# Patient Record
Sex: Female | Born: 1945 | Race: White | Hispanic: No | Marital: Married | Smoking: Former smoker
Health system: Southern US, Community
[De-identification: ages and names within clinical notes are randomized; demographics above are authoritative.]

## PROBLEM LIST (undated history)

## (undated) DIAGNOSIS — I671 Cerebral aneurysm, nonruptured: Secondary | ICD-10-CM

## (undated) DIAGNOSIS — K219 Gastro-esophageal reflux disease without esophagitis: Secondary | ICD-10-CM

## (undated) DIAGNOSIS — I10 Essential (primary) hypertension: Secondary | ICD-10-CM

## (undated) DIAGNOSIS — D869 Sarcoidosis, unspecified: Secondary | ICD-10-CM

## (undated) HISTORY — PX: BREAST SURGERY: SHX581

## (undated) HISTORY — PX: HAMMER TOE SURGERY: SHX385

## (undated) HISTORY — PX: INTRACRANIAL ANEURYSM REPAIR: SHX1839

## (undated) HISTORY — PX: FOOT SURGERY: SHX648

## (undated) HISTORY — PX: ABDOMINAL HYSTERECTOMY: SHX81

## (undated) HISTORY — PX: TUMOR EXCISION: SHX421

## (undated) HISTORY — PX: TONSILLECTOMY: SUR1361

## (undated) HISTORY — PX: HEMORRHOID SURGERY: SHX153

## (undated) HISTORY — PX: FUNCTIONAL ENDOSCOPIC SINUS SURGERY: SUR616

---

## 1995-06-19 DIAGNOSIS — I671 Cerebral aneurysm, nonruptured: Secondary | ICD-10-CM

## 1995-06-19 HISTORY — DX: Cerebral aneurysm, nonruptured: I67.1

## 1999-09-13 ENCOUNTER — Encounter: Payer: Self-pay | Admitting: Obstetrics and Gynecology

## 1999-09-13 ENCOUNTER — Encounter: Admission: RE | Admit: 1999-09-13 | Discharge: 1999-09-13 | Payer: Self-pay | Admitting: Obstetrics and Gynecology

## 1999-12-05 ENCOUNTER — Encounter: Admission: RE | Admit: 1999-12-05 | Discharge: 1999-12-05 | Payer: Self-pay | Admitting: Vascular Surgery

## 1999-12-05 ENCOUNTER — Encounter: Payer: Self-pay | Admitting: Obstetrics and Gynecology

## 2000-10-15 ENCOUNTER — Encounter: Payer: Self-pay | Admitting: Surgery

## 2000-10-15 ENCOUNTER — Encounter: Admission: RE | Admit: 2000-10-15 | Discharge: 2000-10-15 | Payer: Self-pay | Admitting: Surgery

## 2001-10-21 ENCOUNTER — Encounter: Admission: RE | Admit: 2001-10-21 | Discharge: 2001-10-21 | Payer: Self-pay | Admitting: Obstetrics and Gynecology

## 2001-10-21 ENCOUNTER — Encounter: Payer: Self-pay | Admitting: Obstetrics and Gynecology

## 2002-11-11 ENCOUNTER — Encounter: Payer: Self-pay | Admitting: Obstetrics and Gynecology

## 2002-11-11 ENCOUNTER — Encounter: Admission: RE | Admit: 2002-11-11 | Discharge: 2002-11-11 | Payer: Self-pay | Admitting: Obstetrics and Gynecology

## 2003-11-23 ENCOUNTER — Encounter: Admission: RE | Admit: 2003-11-23 | Discharge: 2003-11-23 | Payer: Self-pay | Admitting: Obstetrics and Gynecology

## 2004-03-29 ENCOUNTER — Ambulatory Visit: Payer: Self-pay | Admitting: Internal Medicine

## 2005-01-16 ENCOUNTER — Encounter: Admission: RE | Admit: 2005-01-16 | Discharge: 2005-01-16 | Payer: Self-pay | Admitting: Obstetrics and Gynecology

## 2006-03-04 ENCOUNTER — Ambulatory Visit: Payer: Self-pay | Admitting: Gastroenterology

## 2007-04-01 ENCOUNTER — Encounter: Admission: RE | Admit: 2007-04-01 | Discharge: 2007-04-01 | Payer: Self-pay | Admitting: Obstetrics and Gynecology

## 2007-12-12 ENCOUNTER — Ambulatory Visit: Payer: Self-pay | Admitting: Internal Medicine

## 2008-04-27 ENCOUNTER — Encounter: Admission: RE | Admit: 2008-04-27 | Discharge: 2008-04-27 | Payer: Self-pay | Admitting: Obstetrics and Gynecology

## 2008-05-04 ENCOUNTER — Encounter: Admission: RE | Admit: 2008-05-04 | Discharge: 2008-05-04 | Payer: Self-pay | Admitting: Obstetrics and Gynecology

## 2008-11-24 ENCOUNTER — Encounter: Admission: RE | Admit: 2008-11-24 | Discharge: 2008-11-24 | Payer: Self-pay | Admitting: Obstetrics and Gynecology

## 2009-03-28 ENCOUNTER — Ambulatory Visit: Payer: Self-pay | Admitting: Gastroenterology

## 2009-05-25 ENCOUNTER — Encounter: Admission: RE | Admit: 2009-05-25 | Discharge: 2009-05-25 | Payer: Self-pay | Admitting: Obstetrics and Gynecology

## 2009-12-07 ENCOUNTER — Encounter: Admission: RE | Admit: 2009-12-07 | Discharge: 2009-12-07 | Payer: Self-pay | Admitting: Obstetrics and Gynecology

## 2009-12-15 ENCOUNTER — Ambulatory Visit: Payer: Self-pay | Admitting: Internal Medicine

## 2009-12-16 ENCOUNTER — Ambulatory Visit: Payer: Self-pay | Admitting: Cardiothoracic Surgery

## 2009-12-23 ENCOUNTER — Ambulatory Visit: Payer: Self-pay | Admitting: Specialist

## 2009-12-27 ENCOUNTER — Ambulatory Visit: Payer: Self-pay | Admitting: Internal Medicine

## 2010-01-04 ENCOUNTER — Ambulatory Visit: Payer: Self-pay | Admitting: Cardiothoracic Surgery

## 2010-01-16 ENCOUNTER — Ambulatory Visit: Payer: Self-pay | Admitting: Cardiothoracic Surgery

## 2010-01-25 ENCOUNTER — Ambulatory Visit: Payer: Self-pay | Admitting: Cardiothoracic Surgery

## 2010-01-30 ENCOUNTER — Inpatient Hospital Stay: Payer: Self-pay | Admitting: Internal Medicine

## 2010-03-29 ENCOUNTER — Ambulatory Visit: Payer: Self-pay | Admitting: Cardiothoracic Surgery

## 2010-07-09 ENCOUNTER — Encounter: Payer: Self-pay | Admitting: Obstetrics and Gynecology

## 2010-11-20 ENCOUNTER — Ambulatory Visit: Payer: Self-pay | Admitting: Internal Medicine

## 2010-11-30 ENCOUNTER — Other Ambulatory Visit: Payer: Self-pay | Admitting: Internal Medicine

## 2010-11-30 DIAGNOSIS — Z1231 Encounter for screening mammogram for malignant neoplasm of breast: Secondary | ICD-10-CM

## 2010-12-05 ENCOUNTER — Ambulatory Visit
Admission: RE | Admit: 2010-12-05 | Discharge: 2010-12-05 | Disposition: A | Discharge status: Home / Self Care | Payer: Medicare HMO | Source: Ambulatory Visit | Attending: Internal Medicine | Admitting: Internal Medicine

## 2010-12-05 DIAGNOSIS — Z1231 Encounter for screening mammogram for malignant neoplasm of breast: Secondary | ICD-10-CM

## 2011-07-30 IMAGING — US ABDOMEN ULTRASOUND LIMITED
1 series · 17 of 25 positions shown · non-contrast
Comparison: none

REASON FOR EXAM: ruq pain,fever
COMMENTS:

[Series 1: abdomen ultrasound limited · 17 of 37 slices shown]
[im 1/37]
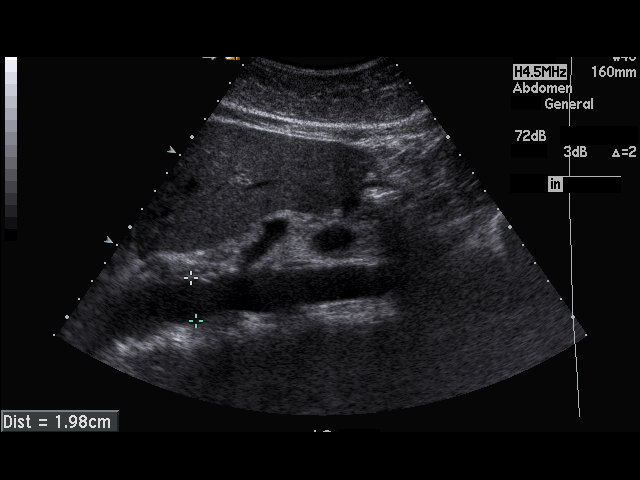
[im 4/37]
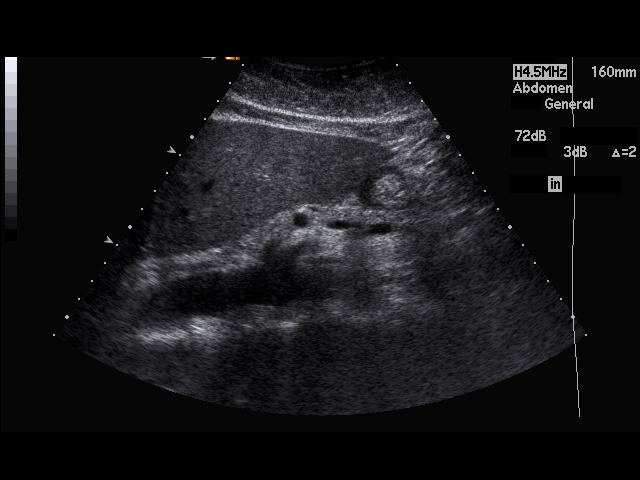
[im 5/37]
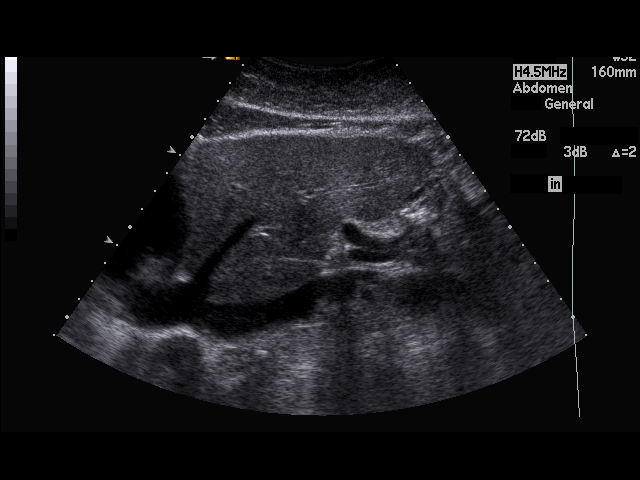
[im 8/37]
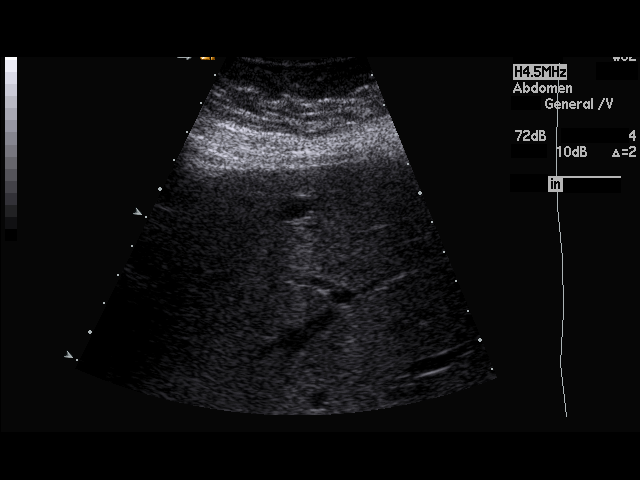
[im 10/37]
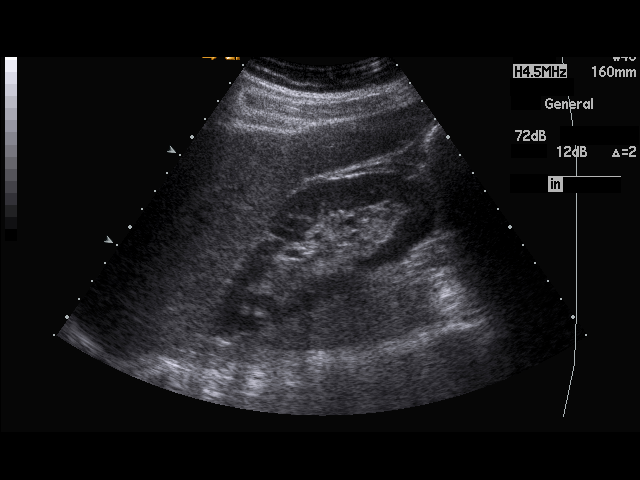
[im 13/37]
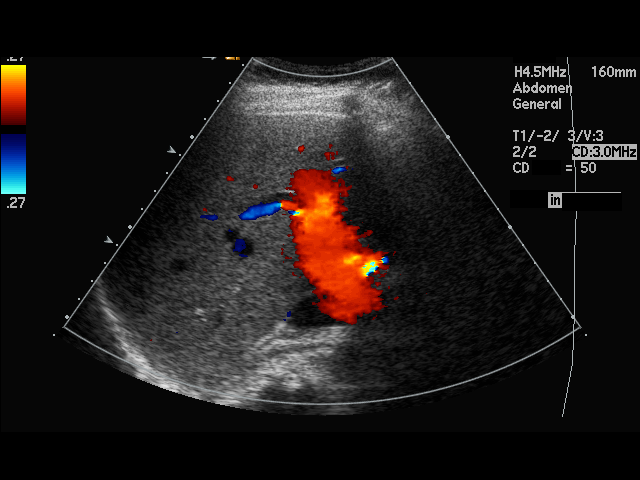
[im 14/37]
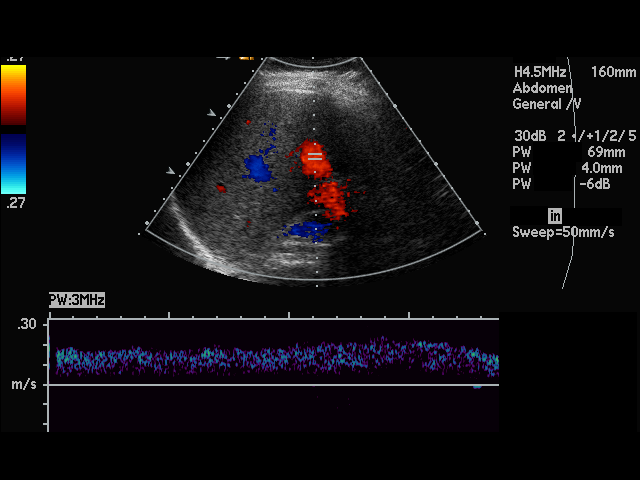
[im 17/37]
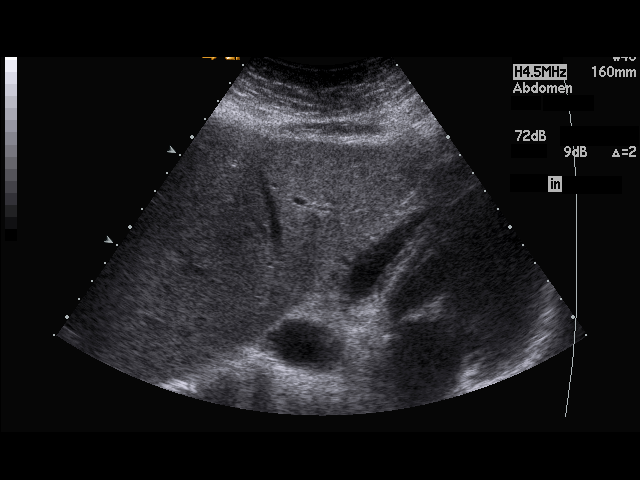
[im 19/37]
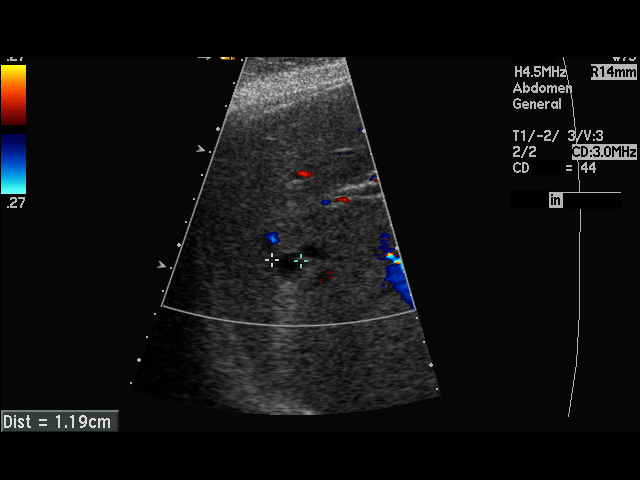
[im 20/37]
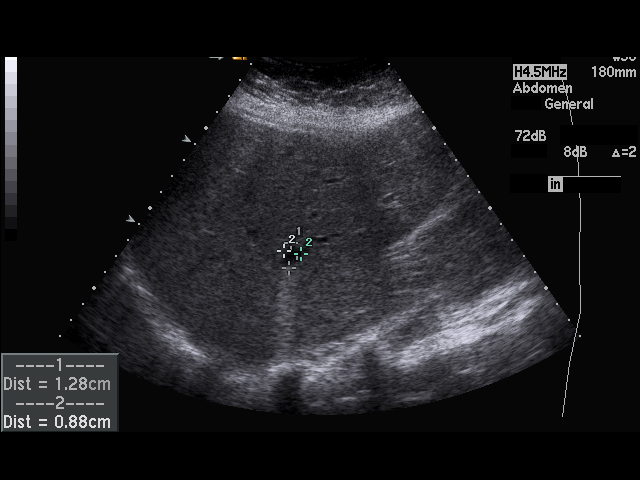
[im 23/37]
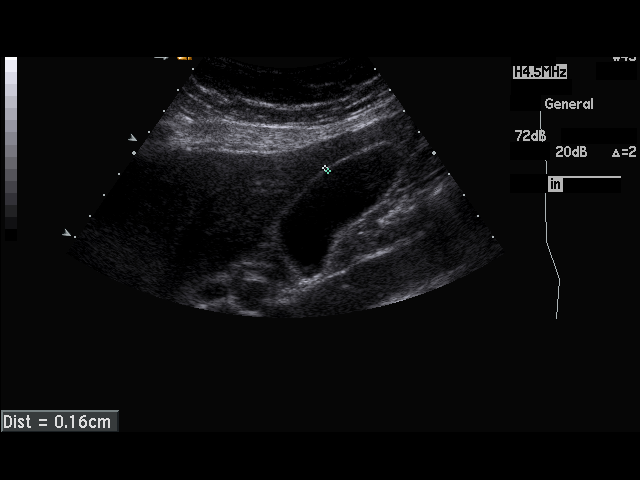
[im 25/37]
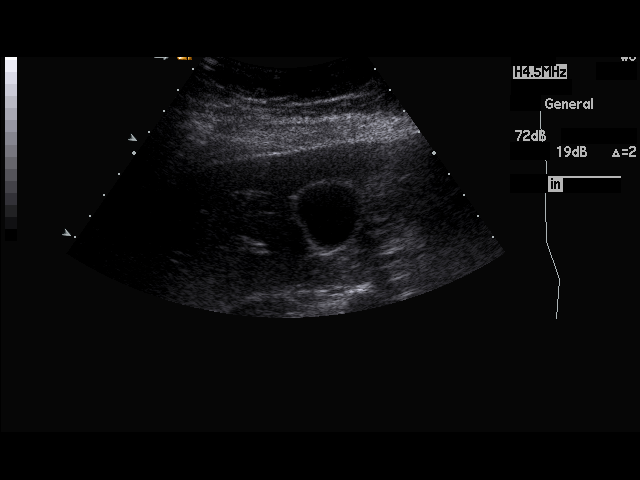
[im 28/37]
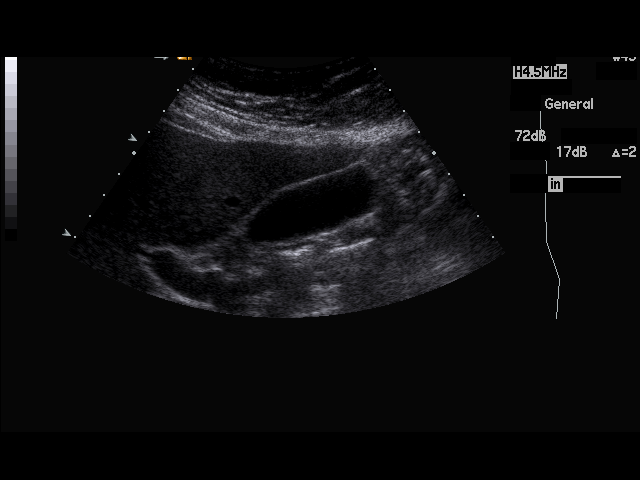
[im 29/37]
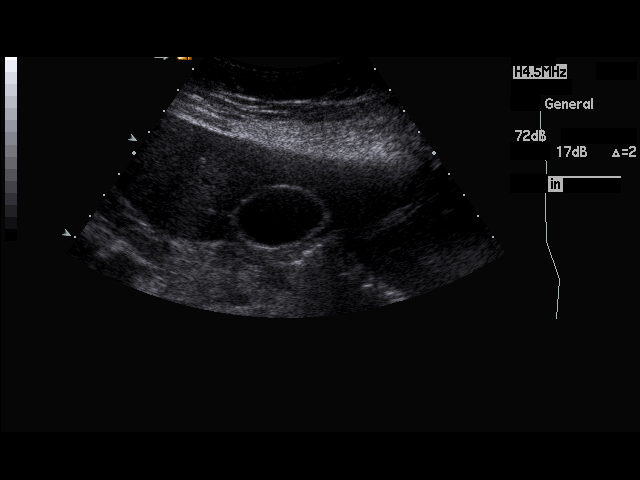
[im 32/37]
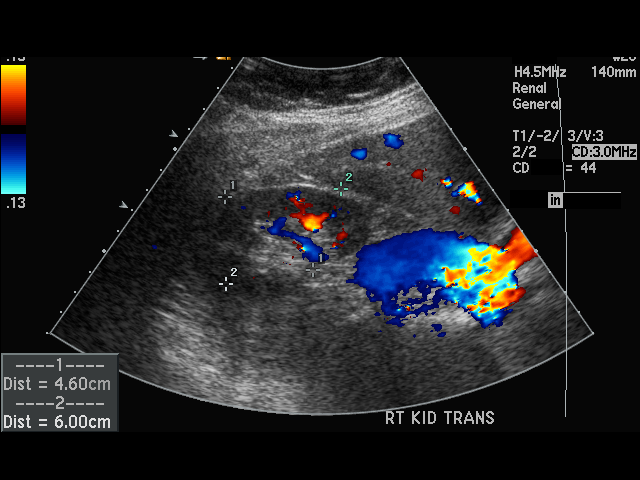
[im 34/37]
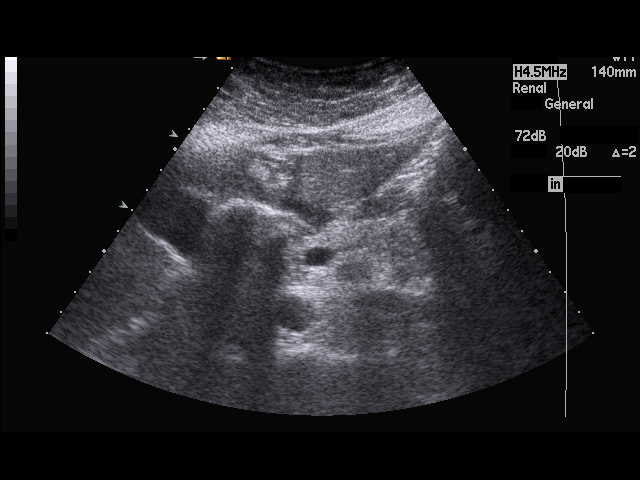
[im 37/37]
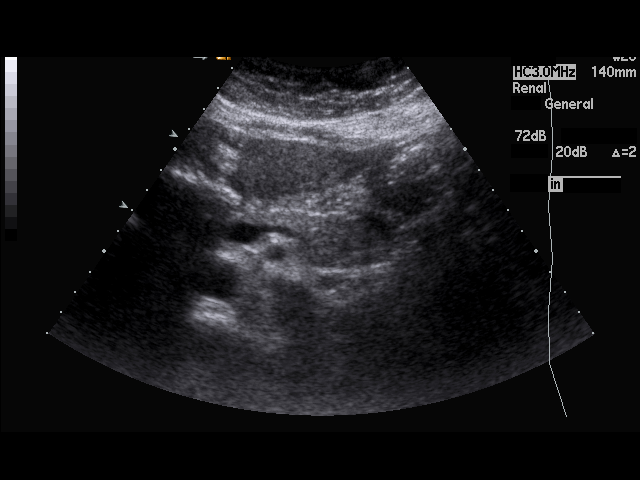

[17 of 25 positions shown; findings below may reference images not displayed]

PROCEDURE:     US  - US ABDOMEN LIMITED SURVEY  - January 31, 2010  [DATE]

RESULT:     Limited examination for evaluation of the right upper quadrant
was performed as requested. No gallstones are seen. There is no thickening
of the gallbladder wall. The common bile duct measures 3.2 mm in diameter
which is within normal limits. The hepatic echo pattern is normal in
appearance except for two small cysts with the larger measuring 1.28 cm at
maximum diameter. The right kidney is visualized and shows no hydronephrosis.
IMPRESSION: Limited examination of the right upper quadrant shows no gallstones or other
acute change. A few hepatic cysts are noted.

## 2011-07-30 IMAGING — CT CT CHEST W/ CM
1 series · 15 of 32 positions shown, 19 images · non-contrast
Comparison: PET/CT -12/23/2009, CT Chest - 12/15/2009

REASON FOR EXAM: COMMENTS:

PROCEDURE:     CT  - CT CHEST (FOR PE) W  - January 31, 2010  [DATE]
RESULT:      Indications: Cough, fever
TECHNIQUE: A thin-section spiral CT from the lung apices to the upper
abdomen was acquired on a multi slice scanner following 100ml Ysovue-FQE
intravenous contrast. These images were then transferred to the Siemens work
station and were subsequently reviewed utilizing 3-D reconstructions and MIP
images.

[Series 4: soft tissue · axial · 0.71mm/px · z∈[-757,-466]mm · 15 of 111 slices shown, 19 images]
[im 9/111  mediastinal]
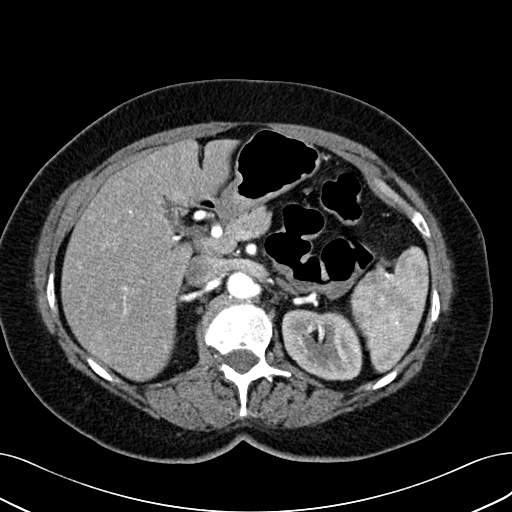
[im 9/111  lung]
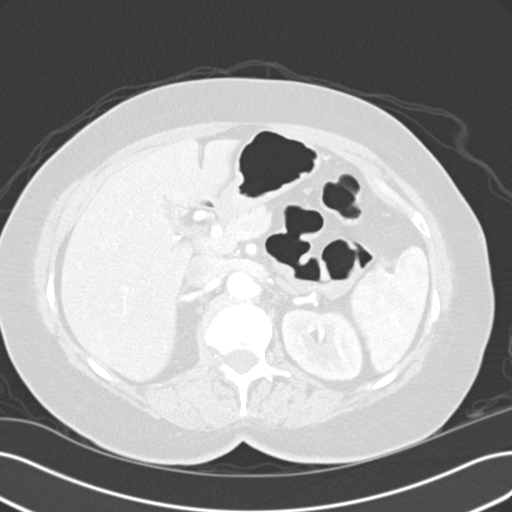
[im 17/111  lung]
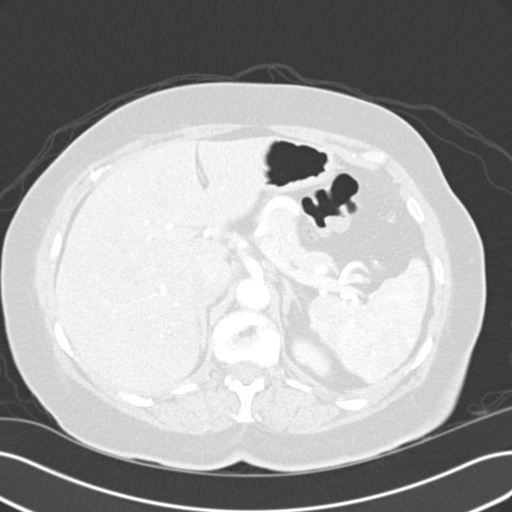
[im 23/111  lung]
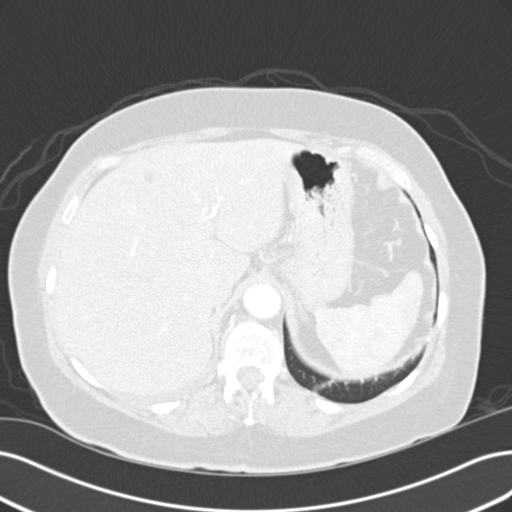
[im 29/111  lung]
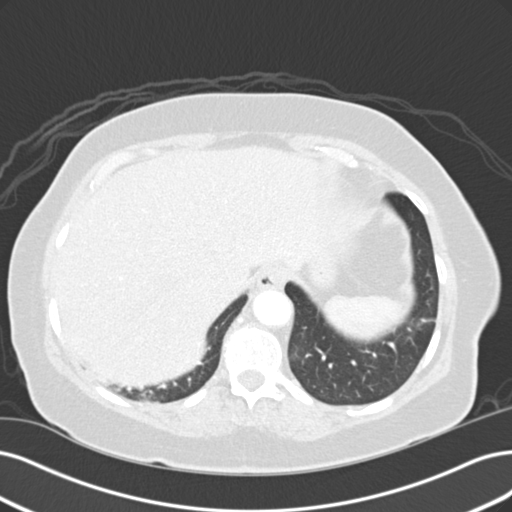
[im 37/111  mediastinal]
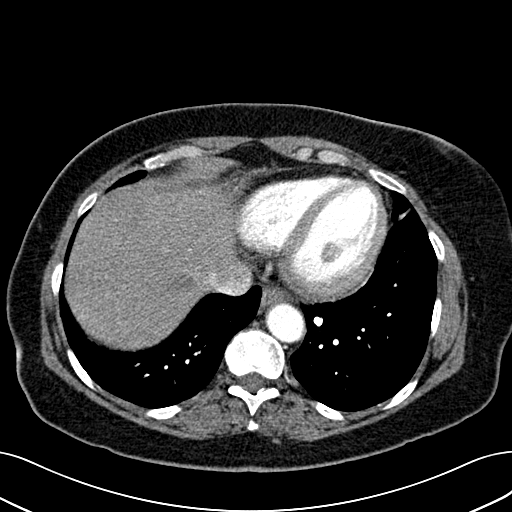
[im 37/111  lung]
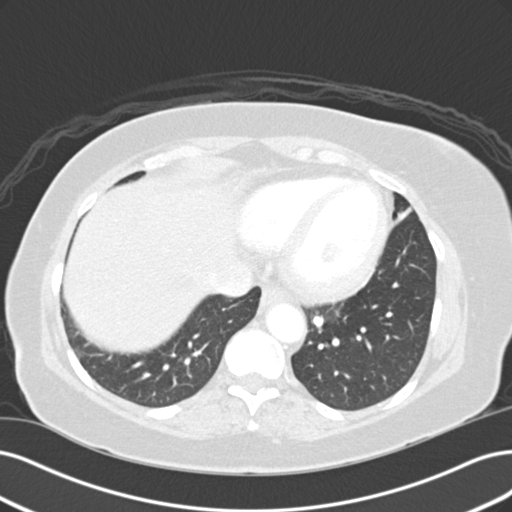
[im 45/111  lung]
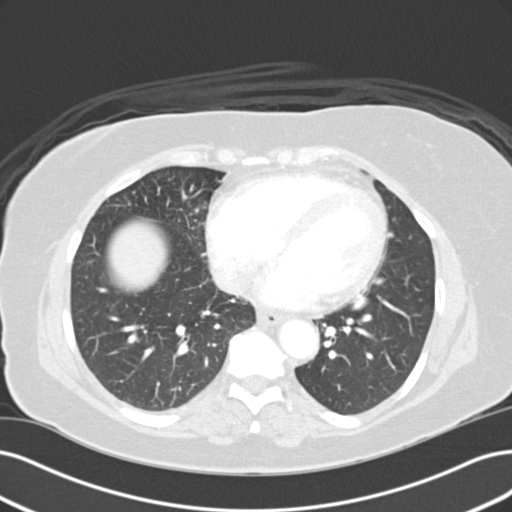
[im 53/111  lung]
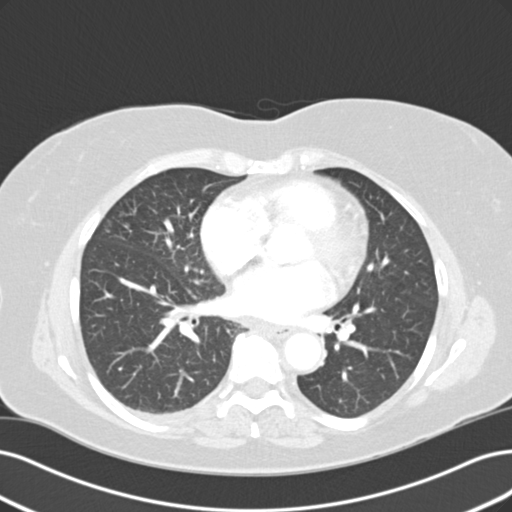
[im 59/111  lung]
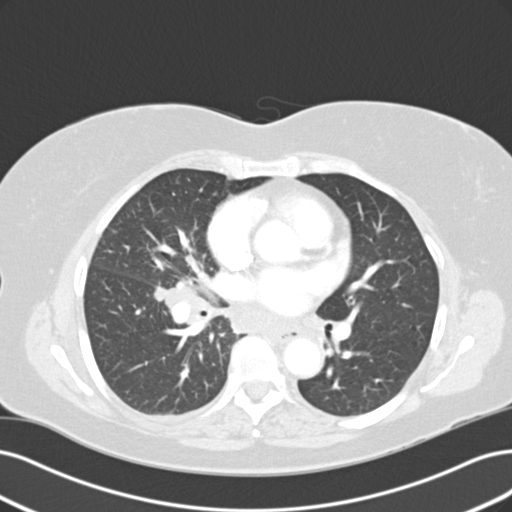
[im 66/111  mediastinal]
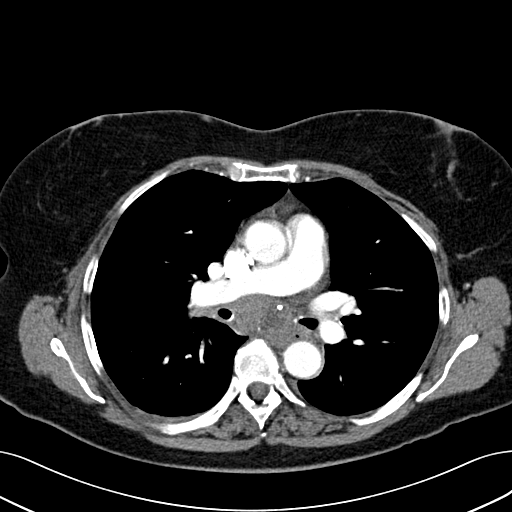
[im 66/111  lung]
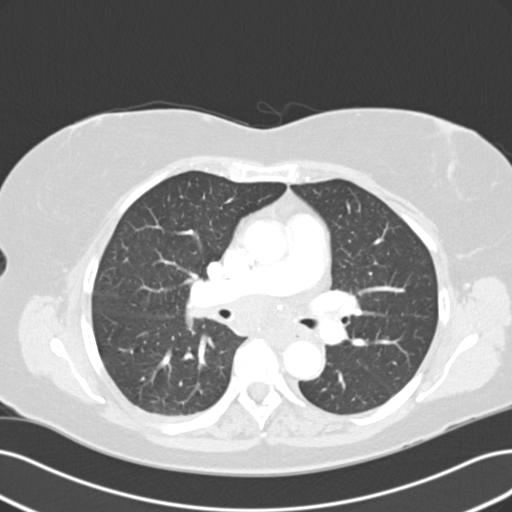
[im 70/111  lung]
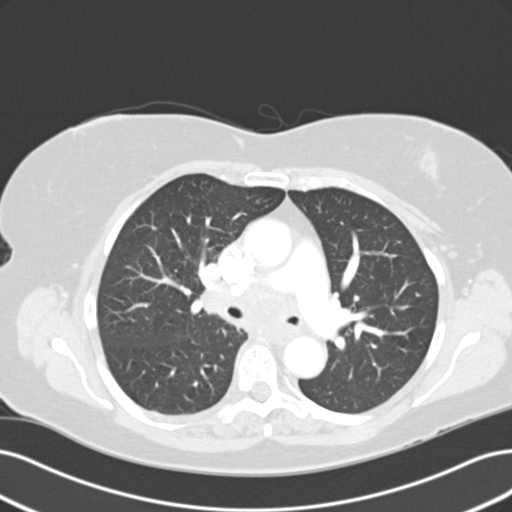
[im 78/111  lung]
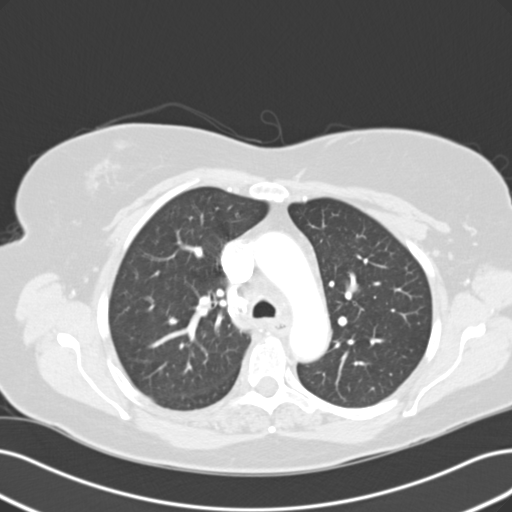
[im 86/111  lung]
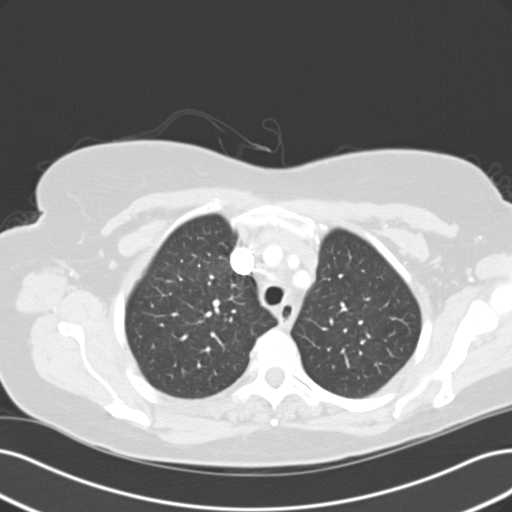
[im 90/111  mediastinal]
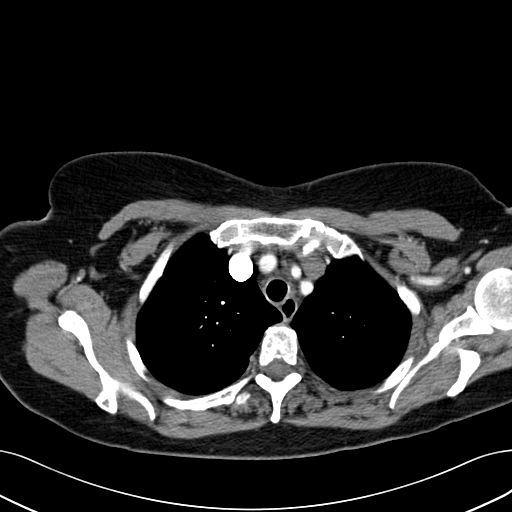
[im 90/111  lung]
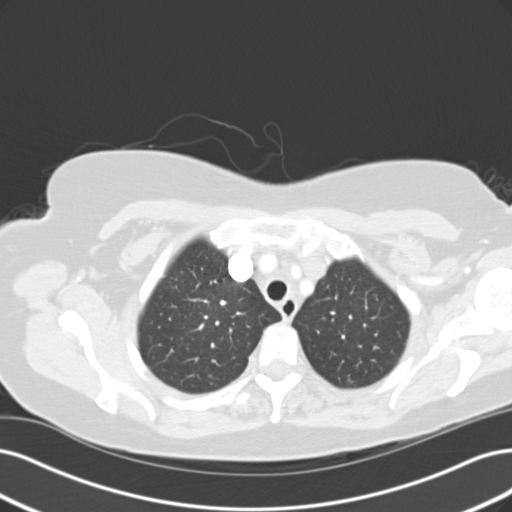
[im 98/111  lung]
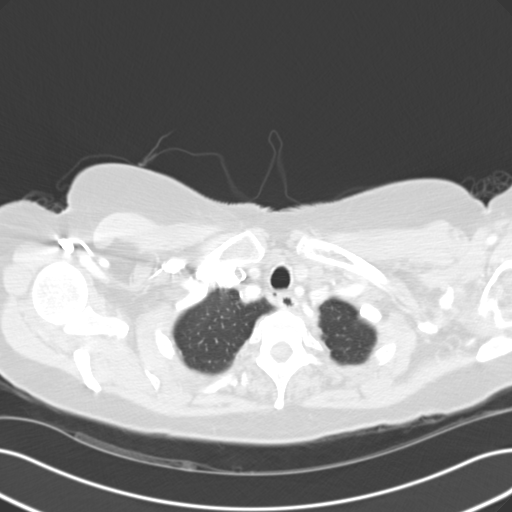
[im 106/111  lung]
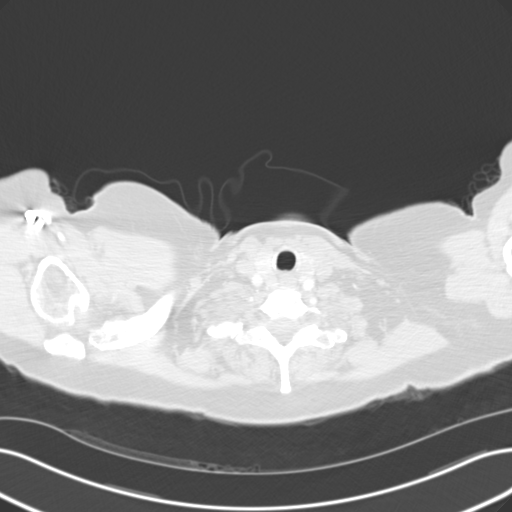

[15 of 32 positions shown; findings below may reference images not displayed]

FINDINGS: There is adequate opacification of the pulmonary arteries. There is no
pulmonary embolus. The main pulmonary artery, right main pulmonary artery,
and left main pulmonary arteries are normal in size. The heart size is
normal. There is no pericardial effusion.

The lungs are clear. There is no focal consolidation, pleural effusion, or
pneumothorax.

There is a large complex cystic subcarinal mass measuring 3.3 x 4.5 x 4.6 cm
with areas of fluid attenuation which may represent necrosis. There are
punctate calcifications within the subcarinal mass.. There is right hilar
lymphadenopathy with the largest right hilar lymph node measuring 1.4 cm.

The osseous structures are unremarkable.

The visualized portions of the upper abdomen are unremarkable.
IMPRESSION: 1. No CT evidence of pulmonary embolus.

2. There is a large complex cystic subcarinal mass measuring 3.3 x 4.5 x
cm with areas of fluid attenuation which may represent necrosis. There are
punctate calcifications within the subcarinal mass. The overall mass has
enlarged compared with the prior examination dated 12/15/2009 at which time
the mass measured 2.1 x 3.6 x 3 cm. Possible etiologies include infection
including atypical infection such as tuberculosis and fungal processes
versus necrotic malignancy. Recommend pulmonary consultation. Correlate with
PPD.

## 2011-09-28 ENCOUNTER — Other Ambulatory Visit: Payer: Self-pay | Admitting: Obstetrics

## 2011-09-28 DIAGNOSIS — E2839 Other primary ovarian failure: Secondary | ICD-10-CM

## 2011-09-28 DIAGNOSIS — Z780 Asymptomatic menopausal state: Secondary | ICD-10-CM

## 2011-09-28 DIAGNOSIS — Z1231 Encounter for screening mammogram for malignant neoplasm of breast: Secondary | ICD-10-CM

## 2011-12-25 ENCOUNTER — Ambulatory Visit
Admission: RE | Admit: 2011-12-25 | Discharge: 2011-12-25 | Disposition: A | Discharge status: Home / Self Care | Payer: Medicare Other | Source: Ambulatory Visit | Attending: Obstetrics | Admitting: Obstetrics

## 2011-12-25 DIAGNOSIS — Z780 Asymptomatic menopausal state: Secondary | ICD-10-CM

## 2011-12-25 DIAGNOSIS — Z1231 Encounter for screening mammogram for malignant neoplasm of breast: Secondary | ICD-10-CM

## 2011-12-25 DIAGNOSIS — E2839 Other primary ovarian failure: Secondary | ICD-10-CM

## 2012-03-26 ENCOUNTER — Ambulatory Visit: Payer: Self-pay | Admitting: Orthopedic Surgery

## 2012-12-24 ENCOUNTER — Ambulatory Visit: Payer: Self-pay | Admitting: Gastroenterology

## 2013-12-03 ENCOUNTER — Other Ambulatory Visit: Payer: Self-pay

## 2013-12-03 DIAGNOSIS — Z1231 Encounter for screening mammogram for malignant neoplasm of breast: Secondary | ICD-10-CM

## 2014-01-12 ENCOUNTER — Ambulatory Visit
Admission: RE | Admit: 2014-01-12 | Discharge: 2014-01-12 | Disposition: A | Discharge status: Home / Self Care | Payer: Medicare Other | Source: Ambulatory Visit | Attending: Obstetrics | Admitting: Obstetrics

## 2014-01-12 ENCOUNTER — Ambulatory Visit
Admission: RE | Admit: 2014-01-12 | Discharge: 2014-01-12 | Disposition: A | Discharge status: Home / Self Care | Payer: Medicare Other | Source: Ambulatory Visit

## 2014-01-12 ENCOUNTER — Encounter (INDEPENDENT_AMBULATORY_CARE_PROVIDER_SITE_OTHER): Payer: Self-pay

## 2014-01-12 ENCOUNTER — Other Ambulatory Visit: Payer: Self-pay | Admitting: Obstetrics

## 2014-01-12 DIAGNOSIS — E2839 Other primary ovarian failure: Secondary | ICD-10-CM

## 2014-01-12 DIAGNOSIS — Z1231 Encounter for screening mammogram for malignant neoplasm of breast: Secondary | ICD-10-CM

## 2015-08-26 ENCOUNTER — Encounter: Payer: Self-pay | Admitting: *Deleted

## 2015-08-29 ENCOUNTER — Ambulatory Visit: Payer: Medicare Other | Admitting: Registered Nurse

## 2015-08-29 ENCOUNTER — Encounter
Admission: RE | Disposition: A | Discharge status: Home / Self Care | Payer: Self-pay | Source: Ambulatory Visit | Attending: Gastroenterology

## 2015-08-29 ENCOUNTER — Ambulatory Visit
Admission: RE | Admit: 2015-08-29 | Discharge: 2015-08-29 | Disposition: A | Discharge status: Home / Self Care | Payer: Medicare Other | Source: Ambulatory Visit | Attending: Gastroenterology | Admitting: Gastroenterology

## 2015-08-29 ENCOUNTER — Encounter: Payer: Self-pay | Admitting: *Deleted

## 2015-08-29 DIAGNOSIS — Z7951 Long term (current) use of inhaled steroids: Secondary | ICD-10-CM | POA: Insufficient documentation

## 2015-08-29 DIAGNOSIS — Z87891 Personal history of nicotine dependence: Secondary | ICD-10-CM | POA: Diagnosis not present

## 2015-08-29 DIAGNOSIS — Z79899 Other long term (current) drug therapy: Secondary | ICD-10-CM | POA: Insufficient documentation

## 2015-08-29 DIAGNOSIS — I10 Essential (primary) hypertension: Secondary | ICD-10-CM | POA: Insufficient documentation

## 2015-08-29 DIAGNOSIS — K219 Gastro-esophageal reflux disease without esophagitis: Secondary | ICD-10-CM | POA: Diagnosis not present

## 2015-08-29 DIAGNOSIS — Z888 Allergy status to other drugs, medicaments and biological substances status: Secondary | ICD-10-CM | POA: Insufficient documentation

## 2015-08-29 DIAGNOSIS — K222 Esophageal obstruction: Secondary | ICD-10-CM | POA: Insufficient documentation

## 2015-08-29 DIAGNOSIS — I739 Peripheral vascular disease, unspecified: Secondary | ICD-10-CM | POA: Diagnosis not present

## 2015-08-29 DIAGNOSIS — R1310 Dysphagia, unspecified: Secondary | ICD-10-CM | POA: Insufficient documentation

## 2015-08-29 HISTORY — DX: Gastro-esophageal reflux disease without esophagitis: K21.9

## 2015-08-29 HISTORY — DX: Sarcoidosis, unspecified: D86.9

## 2015-08-29 HISTORY — DX: Essential (primary) hypertension: I10

## 2015-08-29 HISTORY — DX: Cerebral aneurysm, nonruptured: I67.1

## 2015-08-29 HISTORY — PX: ESOPHAGOGASTRODUODENOSCOPY (EGD) WITH PROPOFOL: SHX5813

## 2015-08-29 SURGERY — ESOPHAGOGASTRODUODENOSCOPY (EGD) WITH PROPOFOL
Anesthesia: General

## 2015-08-29 MED ORDER — SODIUM CHLORIDE 0.9 % IV SOLN: INTRAVENOUS | Status: DC

## 2015-08-29 MED ORDER — PROPOFOL 10 MG/ML IV BOLUS
INTRAVENOUS | Status: DC | PRN
  Administered 2015-08-29: 100 mg via INTRAVENOUS

## 2015-08-29 MED ORDER — SODIUM CHLORIDE 0.9 % IV SOLN
INTRAVENOUS | Status: DC
  Administered 2015-08-29: 09:00:00 via INTRAVENOUS

## 2015-08-29 NOTE — H&P (Signed)
  Date of Initial H&P: 08/03/2015  History reviewed, patient examined, no change in status, stable for surgery.

## 2015-08-29 NOTE — Op Note (Signed)
[facility] Gastroenterology Patient Name: Kristina Le Procedure Date: 08/29/2015 9:01 AM MRN: [id number] Account #: [id number] Date of Birth: [birth year] Admit Type: Outpatient Age: 70 Room: [facility] ENDO ROOM 4 Gender: Female Note Status: Finalized Procedure:            Upper GI endoscopy Indications:          Dysphagia Providers:            Ezzard Standing. Bluford Kaufmann, MD Referring MD:         Danella Penton, MD (Referring MD) Medicines:            Monitored Anesthesia Care Complications:        No immediate complications. Procedure:            Pre-Anesthesia Assessment:                       - Prior to the procedure, a History and Physical was                        performed, and patient medications, allergies and                        sensitivities were reviewed. The patient's tolerance of                        previous anesthesia was reviewed.                       - The risks and benefits of the procedure and the                        sedation options and risks were discussed with the                        patient. All questions were answered and informed                        consent was obtained.                       - After reviewing the risks and benefits, the patient                        was deemed in satisfactory condition to undergo the                        procedure.                       After obtaining informed consent, the endoscope was                        passed under direct vision. Throughout the procedure,                        the patient's blood pressure, pulse, and oxygen                        saturations were monitored continuously. The Endoscope  was introduced through the mouth, and advanced to the                        second part of duodenum. The upper GI endoscopy was                        accomplished without difficulty. The patient tolerated                        the procedure well. Findings:      One  mild benign-appearing, intrinsic stenosis was found in the lower       third of the esophagus. The scope was withdrawn. Dilation was performed       with a Maloney dilator with mild resistance at 54 Fr.      The exam was otherwise without abnormality.      The exam of the esophagus was otherwise normal.      The entire examined stomach was normal.      The examined duodenum was normal. Impression:           - Benign-appearing esophageal stenosis. Dilated.                       - The examination was otherwise normal.                       - Normal stomach.                       - Normal examined duodenum.                       - No specimens collected. Recommendation:       - Discharge patient to home.                       - Observe patient's clinical course.                       - The findings and recommendations were discussed with                        the patient. Procedure Code(s):    --- Professional ---                       [phone], Esophagogastroduodenoscopy, flexible, transoral;                        diagnostic, including collection of specimen(s) by                        brushing or washing, when performed (separate procedure)                       43450, Dilation of esophagus, by unguided sound or                        bougie, single or multiple passes Diagnosis Code(s):    --- Professional ---                       K22.2, Esophageal obstruction  R13.10, Dysphagia, unspecified CPT copyright 2016 American Medical Association. All rights reserved. The codes documented in this report are preliminary and upon coder review may  be revised to meet current compliance requirements. Wallace CullensPaul Y Keiry Kowal, MD 08/29/2015 9:10:49 AM This report has been signed electronically. Number of Addenda: 0 Note Initiated On: 08/29/2015 9:01 AM      [facility]

## 2015-08-29 NOTE — Anesthesia Postprocedure Evaluation (Signed)
Anesthesia Post Note  Patient: Kristina Le  Procedure(s) Performed: Procedure(s) (LRB): ESOPHAGOGASTRODUODENOSCOPY (EGD) WITH PROPOFOL (N/A)  Patient location during evaluation: PACU Anesthesia Type: General Level of consciousness: awake Pain management: satisfactory to patient Vital Signs Assessment: post-procedure vital signs reviewed and stable Respiratory status: nonlabored ventilation Cardiovascular status: stable Anesthetic complications: no    Last Vitals:  Filed Vitals:   08/29/15 0840 08/29/15 0912  BP: 137/67 135/74  Pulse: 68 75  Temp: 36.3 C 36.6 C  Resp: 15 16    Last Pain: There were no vitals filed for this visit.               VAN STAVEREN,Laramie Gelles

## 2015-08-29 NOTE — Transfer of Care (Signed)
Immediate Anesthesia Transfer of Care Note  Patient: Kristina Le  Procedure(s) Performed: Procedure(s): ESOPHAGOGASTRODUODENOSCOPY (EGD) WITH PROPOFOL (N/A)  Patient Location: PACU  Anesthesia Type:General  Level of Consciousness: awake and alert   Airway & Oxygen Therapy: Patient Spontanous Breathing and Patient connected to nasal cannula oxygen  Post-op Assessment: Report given to RN  Post vital signs: Reviewed and stable  Last Vitals:  Filed Vitals:   08/29/15 0840 08/29/15 0912  BP: 137/67 135/74  Pulse: 68 75  Temp: 36.3 C 36.6 C  Resp: 15 16    Complications: No apparent anesthesia complications

## 2015-08-29 NOTE — Anesthesia Preprocedure Evaluation (Signed)
Anesthesia Evaluation  Patient identified by MRN, date of birth, ID band Patient awake    Reviewed: Allergy & Precautions, NPO status , Patient's Chart, lab work & pertinent test results  Airway Mallampati: II       Dental  (+) Teeth Intact   Pulmonary former smoker,    breath sounds clear to auscultation       Cardiovascular hypertension, Pt. on medications + Peripheral Vascular Disease   Rhythm:Regular     Neuro/Psych    GI/Hepatic Neg liver ROS, GERD  ,  Endo/Other  negative endocrine ROS  Renal/GU negative Renal ROS     Musculoskeletal   Abdominal   Peds  Hematology negative hematology ROS (+)   Anesthesia Other Findings   Reproductive/Obstetrics                             Anesthesia Physical Anesthesia Plan  ASA: II  Anesthesia Plan: General   Post-op Pain Management:    Induction: Intravenous  Airway Management Planned: Natural Airway and Nasal Cannula  Additional Equipment:   Intra-op Plan:   Post-operative Plan:   Informed Consent: I have reviewed the patients History and Physical, chart, labs and discussed the procedure including the risks, benefits and alternatives for the proposed anesthesia with the patient or authorized representative who has indicated his/her understanding and acceptance.     Plan Discussed with: CRNA  Anesthesia Plan Comments:         Anesthesia Quick Evaluation

## 2015-08-30 ENCOUNTER — Encounter: Payer: Self-pay | Admitting: Gastroenterology

## 2016-04-12 ENCOUNTER — Other Ambulatory Visit: Payer: Self-pay | Admitting: Internal Medicine

## 2016-04-12 DIAGNOSIS — Z1231 Encounter for screening mammogram for malignant neoplasm of breast: Secondary | ICD-10-CM

## 2016-05-28 ENCOUNTER — Ambulatory Visit
Admission: RE | Admit: 2016-05-28 | Discharge: 2016-05-28 | Disposition: A | Discharge status: Home / Self Care | Payer: Medicare Other | Source: Ambulatory Visit | Attending: Internal Medicine | Admitting: Internal Medicine

## 2016-05-28 DIAGNOSIS — Z1231 Encounter for screening mammogram for malignant neoplasm of breast: Secondary | ICD-10-CM

## 2016-05-30 ENCOUNTER — Other Ambulatory Visit: Payer: Self-pay | Admitting: Internal Medicine

## 2016-05-30 DIAGNOSIS — R928 Other abnormal and inconclusive findings on diagnostic imaging of breast: Secondary | ICD-10-CM

## 2016-06-20 ENCOUNTER — Ambulatory Visit
Admission: RE | Admit: 2016-06-20 | Discharge: 2016-06-20 | Disposition: A | Discharge status: Home / Self Care | Payer: Medicare Other | Source: Ambulatory Visit | Attending: Internal Medicine | Admitting: Internal Medicine

## 2016-06-20 DIAGNOSIS — R928 Other abnormal and inconclusive findings on diagnostic imaging of breast: Secondary | ICD-10-CM

## 2017-08-05 ENCOUNTER — Ambulatory Visit
Admission: RE | Admit: 2017-08-05 | Discharge: 2017-08-05 | Disposition: A | Discharge status: Home / Self Care | Payer: Medicare Other | Source: Ambulatory Visit | Attending: Internal Medicine | Admitting: Internal Medicine

## 2017-08-05 ENCOUNTER — Other Ambulatory Visit: Payer: Self-pay | Admitting: Internal Medicine

## 2017-08-05 DIAGNOSIS — H70092 Acute mastoiditis with other complications, left ear: Secondary | ICD-10-CM

## 2017-08-05 DIAGNOSIS — R51 Headache: Secondary | ICD-10-CM

## 2017-08-05 DIAGNOSIS — J322 Chronic ethmoidal sinusitis: Secondary | ICD-10-CM | POA: Insufficient documentation

## 2017-08-05 DIAGNOSIS — R519 Headache, unspecified: Secondary | ICD-10-CM

## 2017-08-05 DIAGNOSIS — L0211 Cutaneous abscess of neck: Secondary | ICD-10-CM | POA: Diagnosis not present

## 2017-08-05 DIAGNOSIS — Z9889 Other specified postprocedural states: Secondary | ICD-10-CM | POA: Insufficient documentation

## 2017-08-05 DIAGNOSIS — I671 Cerebral aneurysm, nonruptured: Secondary | ICD-10-CM | POA: Insufficient documentation

## 2019-03-24 ENCOUNTER — Ambulatory Visit
Admission: RE | Admit: 2019-03-24 | Discharge: 2019-03-24 | Disposition: A | Discharge status: Home / Self Care | Payer: No Typology Code available for payment source | Source: Ambulatory Visit | Attending: Family Medicine | Admitting: Family Medicine

## 2019-03-24 ENCOUNTER — Other Ambulatory Visit (HOSPITAL_COMMUNITY): Payer: Self-pay | Admitting: Family Medicine

## 2019-03-24 ENCOUNTER — Other Ambulatory Visit: Payer: Self-pay | Admitting: Family Medicine

## 2019-03-24 ENCOUNTER — Other Ambulatory Visit: Payer: Self-pay

## 2019-03-24 DIAGNOSIS — G44319 Acute post-traumatic headache, not intractable: Secondary | ICD-10-CM | POA: Diagnosis present

## 2021-10-26 ENCOUNTER — Other Ambulatory Visit: Payer: Self-pay

## 2021-10-26 ENCOUNTER — Observation Stay
Admission: EM | Admit: 2021-10-26 | Discharge: 2021-10-27 | Disposition: A | Discharge status: Home / Self Care | Payer: Medicare Other | Attending: Hospitalist | Admitting: Hospitalist

## 2021-10-26 ENCOUNTER — Emergency Department: Payer: Medicare Other

## 2021-10-26 ENCOUNTER — Observation Stay: Payer: Medicare Other

## 2021-10-26 DIAGNOSIS — D62 Acute posthemorrhagic anemia: Secondary | ICD-10-CM | POA: Insufficient documentation

## 2021-10-26 DIAGNOSIS — Z79899 Other long term (current) drug therapy: Secondary | ICD-10-CM | POA: Insufficient documentation

## 2021-10-26 DIAGNOSIS — Z85828 Personal history of other malignant neoplasm of skin: Secondary | ICD-10-CM | POA: Insufficient documentation

## 2021-10-26 DIAGNOSIS — E876 Hypokalemia: Secondary | ICD-10-CM | POA: Insufficient documentation

## 2021-10-26 DIAGNOSIS — Z87891 Personal history of nicotine dependence: Secondary | ICD-10-CM | POA: Diagnosis not present

## 2021-10-26 DIAGNOSIS — R739 Hyperglycemia, unspecified: Secondary | ICD-10-CM | POA: Diagnosis not present

## 2021-10-26 DIAGNOSIS — J45909 Unspecified asthma, uncomplicated: Secondary | ICD-10-CM | POA: Diagnosis not present

## 2021-10-26 DIAGNOSIS — R112 Nausea with vomiting, unspecified: Secondary | ICD-10-CM | POA: Diagnosis not present

## 2021-10-26 DIAGNOSIS — N2889 Other specified disorders of kidney and ureter: Secondary | ICD-10-CM | POA: Diagnosis not present

## 2021-10-26 DIAGNOSIS — I10 Essential (primary) hypertension: Secondary | ICD-10-CM | POA: Diagnosis not present

## 2021-10-26 DIAGNOSIS — R109 Unspecified abdominal pain: Secondary | ICD-10-CM

## 2021-10-26 DIAGNOSIS — R1031 Right lower quadrant pain: Secondary | ICD-10-CM | POA: Diagnosis present

## 2021-10-26 LAB — URINALYSIS, ROUTINE W REFLEX MICROSCOPIC
Bilirubin Urine: NEGATIVE
Glucose, UA: NEGATIVE mg/dL
Ketones, ur: NEGATIVE mg/dL
Leukocytes,Ua: NEGATIVE
Nitrite: NEGATIVE
Protein, ur: NEGATIVE mg/dL
Specific Gravity, Urine: 1.019 (ref 1.005–1.030)
pH: 5 (ref 5.0–8.0)

## 2021-10-26 LAB — CBC
HCT: 36.6 % (ref 36.0–46.0)
Hemoglobin: 12.1 g/dL (ref 12.0–15.0)
MCH: 30 pg (ref 26.0–34.0)
MCHC: 33.1 g/dL (ref 30.0–36.0)
MCV: 90.8 fL (ref 80.0–100.0)
Platelets: 345 10*3/uL (ref 150–400)
RBC: 4.03 MIL/uL (ref 3.87–5.11)
RDW: 13.2 % (ref 11.5–15.5)
WBC: 8 10*3/uL (ref 4.0–10.5)
nRBC: 0 % (ref 0.0–0.2)

## 2021-10-26 LAB — APTT: aPTT: 23 seconds — ABNORMAL LOW (ref 24–36)

## 2021-10-26 LAB — COMPREHENSIVE METABOLIC PANEL
ALT: 20 U/L (ref 0–44)
AST: 26 U/L (ref 15–41)
Albumin: 3.6 g/dL (ref 3.5–5.0)
Alkaline Phosphatase: 45 U/L (ref 38–126)
Anion gap: 10 (ref 5–15)
BUN: 20 mg/dL (ref 8–23)
CO2: 29 mmol/L (ref 22–32)
Calcium: 9 mg/dL (ref 8.9–10.3)
Chloride: 100 mmol/L (ref 98–111)
Creatinine, Ser: 0.63 mg/dL (ref 0.44–1.00)
GFR, Estimated: >60 mL/min (ref >60)
Glucose, Bld: 184 mg/dL — ABNORMAL HIGH (ref 70–99)
Potassium: 3 mmol/L — ABNORMAL LOW (ref 3.5–5.1)
Sodium: 139 mmol/L (ref 135–145)
Total Bilirubin: 0.6 mg/dL (ref 0.3–1.2)
Total Protein: 6.7 g/dL (ref 6.5–8.1)

## 2021-10-26 LAB — TYPE AND SCREEN
ABO/RH(D): O POS
Antibody Screen: NEGATIVE

## 2021-10-26 LAB — GLUCOSE, CAPILLARY: Glucose-Capillary: 140 mg/dL — ABNORMAL HIGH (ref 70–99)

## 2021-10-26 LAB — PROTIME-INR
INR: 1 (ref 0.8–1.2)
Prothrombin Time: 13.1 seconds (ref 11.4–15.2)

## 2021-10-26 LAB — LIPASE, BLOOD: Lipase: 28 U/L (ref 11–51)

## 2021-10-26 MED ORDER — HYDROMORPHONE HCL 1 MG/ML IJ SOLN: 0.5000 mg | INTRAMUSCULAR | Status: DC | PRN

## 2021-10-26 MED ORDER — ONDANSETRON HCL 4 MG/2ML IJ SOLN
4.0000 mg | Freq: Four times a day (QID) | INTRAMUSCULAR | Status: DC | PRN
  Administered 2021-10-26: 4 mg via INTRAVENOUS
  Filled 2021-10-26: qty 2

## 2021-10-26 MED ORDER — HYDRALAZINE HCL 20 MG/ML IJ SOLN: 5.0000 mg | Freq: Four times a day (QID) | INTRAMUSCULAR | Status: DC | PRN

## 2021-10-26 MED ORDER — ONDANSETRON HCL 4 MG/2ML IJ SOLN
4.0000 mg | Freq: Once | INTRAMUSCULAR | Status: AC
  Administered 2021-10-26: 4 mg via INTRAVENOUS
  Filled 2021-10-26: qty 2

## 2021-10-26 MED ORDER — PANTOPRAZOLE SODIUM 40 MG PO TBEC
40.0000 mg | DELAYED_RELEASE_TABLET | Freq: Every day | ORAL | Status: DC
  Administered 2021-10-27: 40 mg via ORAL
  Filled 2021-10-26: qty 1

## 2021-10-26 MED ORDER — HYDROMORPHONE HCL 1 MG/ML IJ SOLN
1.0000 mg | Freq: Once | INTRAMUSCULAR | Status: AC
  Administered 2021-10-26: 1 mg via INTRAVENOUS
  Filled 2021-10-26: qty 1

## 2021-10-26 MED ORDER — INSULIN ASPART 100 UNIT/ML IJ SOLN: 0.0000 [IU] | Freq: Every day | INTRAMUSCULAR | Status: DC

## 2021-10-26 MED ORDER — POTASSIUM CHLORIDE 2 MEQ/ML IV SOLN
INTRAVENOUS | Status: AC
  Filled 2021-10-26 (×3): qty 1000

## 2021-10-26 MED ORDER — BUPROPION HCL ER (XL) 150 MG PO TB24
150.0000 mg | ORAL_TABLET | Freq: Every day | ORAL | Status: DC
Start: 2021-10-26 — End: 2021-10-27
  Administered 2021-10-27: 150 mg via ORAL
  Filled 2021-10-26 (×2): qty 1

## 2021-10-26 MED ORDER — ALBUTEROL SULFATE (2.5 MG/3ML) 0.083% IN NEBU: 3.0000 mL | INHALATION_SOLUTION | Freq: Four times a day (QID) | RESPIRATORY_TRACT | Status: DC | PRN

## 2021-10-26 MED ORDER — IOHEXOL 300 MG/ML  SOLN
50.0000 mL | Freq: Once | INTRAMUSCULAR | Status: AC | PRN
  Administered 2021-10-26: 50 mL via INTRAVENOUS

## 2021-10-26 MED ORDER — MELATONIN 5 MG PO TABS
5.0000 mg | ORAL_TABLET | Freq: Every evening | ORAL | Status: DC | PRN
  Administered 2021-10-26: 5 mg via ORAL
  Filled 2021-10-26: qty 1

## 2021-10-26 MED ORDER — BUPROPION HCL ER (SR) 150 MG PO TB12: 150.0000 mg | ORAL_TABLET | Freq: Every day | ORAL | Status: DC

## 2021-10-26 MED ORDER — AMLODIPINE BESYLATE 5 MG PO TABS
5.0000 mg | ORAL_TABLET | Freq: Every day | ORAL | Status: DC
  Administered 2021-10-27: 5 mg via ORAL
  Filled 2021-10-26 (×2): qty 1

## 2021-10-26 MED ORDER — MOMETASONE FURO-FORMOTEROL FUM 100-5 MCG/ACT IN AERO
2.0000 | INHALATION_SPRAY | Freq: Two times a day (BID) | RESPIRATORY_TRACT | Status: DC
  Administered 2021-10-27: 2 via RESPIRATORY_TRACT
  Filled 2021-10-26: qty 8.8

## 2021-10-26 MED ORDER — POTASSIUM CHLORIDE 10 MEQ/100ML IV SOLN
10.0000 meq | INTRAVENOUS | Status: AC
  Administered 2021-10-26 (×3): 10 meq via INTRAVENOUS
  Filled 2021-10-26 (×3): qty 100

## 2021-10-26 MED ORDER — MONTELUKAST SODIUM 10 MG PO TABS
10.0000 mg | ORAL_TABLET | Freq: Every evening | ORAL | Status: DC
  Administered 2021-10-26: 10 mg via ORAL
  Filled 2021-10-26: qty 1

## 2021-10-26 MED ORDER — MORPHINE SULFATE (PF) 4 MG/ML IV SOLN
4.0000 mg | Freq: Once | INTRAVENOUS | Status: AC
  Administered 2021-10-26: 4 mg via INTRAVENOUS
  Filled 2021-10-26: qty 1

## 2021-10-26 MED ORDER — POTASSIUM CHLORIDE CRYS ER 20 MEQ PO TBCR
40.0000 meq | EXTENDED_RELEASE_TABLET | Freq: Two times a day (BID) | ORAL | Status: DC
  Filled 2021-10-26: qty 2

## 2021-10-26 MED ORDER — INSULIN ASPART 100 UNIT/ML IJ SOLN
0.0000 [IU] | Freq: Three times a day (TID) | INTRAMUSCULAR | Status: DC
  Administered 2021-10-27: 2 [IU] via SUBCUTANEOUS
  Administered 2021-10-27: 1 [IU] via SUBCUTANEOUS
  Filled 2021-10-26 (×2): qty 1

## 2021-10-26 MED ORDER — OXYCODONE HCL 5 MG PO TABS: 5.0000 mg | ORAL_TABLET | ORAL | Status: DC | PRN

## 2021-10-26 MED ORDER — POLYETHYLENE GLYCOL 3350 17 G PO PACK: 17.0000 g | PACK | Freq: Every day | ORAL | Status: DC | PRN

## 2021-10-26 MED ORDER — IOHEXOL 300 MG/ML  SOLN
100.0000 mL | Freq: Once | INTRAMUSCULAR | Status: AC | PRN
  Administered 2021-10-26: 100 mL via INTRAVENOUS

## 2021-10-26 MED ORDER — FAMOTIDINE 20 MG PO TABS
40.0000 mg | ORAL_TABLET | Freq: Every day | ORAL | Status: DC
  Administered 2021-10-26: 40 mg via ORAL
  Filled 2021-10-26: qty 2

## 2021-10-26 NOTE — ED Triage Notes (Signed)
Pt comes into the ED via EMS from home with c/o abd pain right after taking protonix this morning for the first time, one episode of vomiting ?164/82 ?WS56 ?CBG148 ?98%RA ?#20gLAC ?

## 2021-10-26 NOTE — ED Provider Notes (Signed)
? ? ?[facility] ?Emergency Department Provider Note ? ? ? ? Event Date/Time  ? First MD Initiated Contact with Patient 10/26/21 1131   ?  (approximate) ? ? ?History  ? ?Abdominal Pain ? ? ?HPI ? ?Kristina Le is a 76 y.o. female with a history of hypertension, GERD, and sarcoidosis, resents to the ED via EMS from home.  Patient apparently experienced sudden right upper/lower quadrant abdominal and right flank pain approximately 1 hour after taking a dose of Protonix this morning.  She apparently ate as per usual after taking the medication, but reports this is her first time taking this particular medicine.  She reports 1 episode of nonbloody, nonbilious vomiting.  Vital signs reported stable per EMS.  Upon triage, patient is found to be hypotensive at 71/23 with a normal HR., ? ? ?Physical Exam  ? ?Triage Vital Signs: ?ED Triage Vitals  ?Enc Vitals Group  ?   BP 10/26/21 1124 (!) 71/23  ?   Pulse Rate 10/26/21 1124 85  ?   Resp 10/26/21 1124 16  ?   Temp 10/26/21 1124 97.6 ?F (36.4 ?C)  ?   Temp Source 10/26/21 1124 Oral  ?   SpO2 10/26/21 1124 95 %  ?   Weight 10/26/21 1123 171 lb (77.6 kg)  ?   Height 10/26/21 1123 5' 3.5" (1.613 m)  ?   Head Circumference --   ?   Peak Flow --   ?   Pain Score --   ?   Pain Loc --   ?   Pain Edu? --   ?   Excl. in [place]? --   ? ? ?Most recent vital signs: ?Vitals:  ? 10/26/21 1300 10/26/21 1430  ?BP: 118/60 (!) 142/81  ?Pulse: 66 75  ?Resp: 19 (!) 24  ?Temp:    ?SpO2: 97% 100%  ? ? ?General Awake, no distress.  ?CV:  Good peripheral perfusion. RRR ?RESP:  Normal effort. CTA ?ABD:  No distention. Soft, mildly tender to palp over the RUQ/RLQ. No CVA tenderness noted. ? ? ?ED Results / Procedures / Treatments  ? ?Labs ?(all labs ordered are listed, but only abnormal results are displayed) ?Labs Reviewed  ?COMPREHENSIVE METABOLIC PANEL - Abnormal; Notable for the following components:  ?    Result Value  ? Potassium 3.0 (*)   ? Glucose, Bld 184 (*)   ? All  other components within normal limits  ?URINALYSIS, ROUTINE W REFLEX MICROSCOPIC - Abnormal; Notable for the following components:  ? Color, Urine YELLOW (*)   ? APPearance CLEAR (*)   ? Hgb urine dipstick SMALL (*)   ? Bacteria, UA RARE (*)   ? All other components within normal limits  ?LIPASE, BLOOD  ?CBC  ?PROTIME-INR  ?APTT  ?TYPE AND SCREEN  ? ? ? ?EKG ? ?Vent. rate 69 BPM ?PR interval 151 ms ?QRS duration 103 ms ?QT/QTcB 401/430 ms ?P-R-T axes 71 69 59 ?NSR ? ?RADIOLOGY ? ?I personally viewed and evaluated these images as part of my medical decision making, as well as reviewing the written report by the radiologist. ? ?ED Provider Interpretation: right renal mass noted} ? ?CT ABDOMEN PELVIS W CONTRAST ? ?Result Date: 10/26/2021 ?CLINICAL DATA:  Right abdominal pain. EXAM: CT ABDOMEN AND PELVIS WITH CONTRAST TECHNIQUE: Multidetector CT imaging of the abdomen and pelvis was performed using the standard protocol following bolus administration of intravenous contrast. RADIATION DOSE REDUCTION: This exam was performed according to the departmental dose-optimization  program which includes automated exposure control, adjustment of the mA and/or kV according to patient size and/or use of iterative reconstruction technique. CONTRAST:  155mL OMNIPAQUE IOHEXOL 300 MG/ML  SOLN COMPARISON:  None Available. FINDINGS: Lower chest: Unremarkable. Hepatobiliary: No suspicious focal abnormality within the liver parenchyma. There is no evidence for gallstones, gallbladder wall thickening, or pericholecystic fluid. No intrahepatic or extrahepatic biliary dilation. Pancreas: No focal mass lesion. No dilatation of the main duct. No intraparenchymal cyst. No peripancreatic edema. Spleen: No splenomegaly. No focal mass lesion. Adrenals/Urinary Tract: No adrenal nodule or mass. Left kidney and ureter unremarkable. Bladder is unremarkable. 5.3 x 5.6 x 5.0 cm masslike lesion identified in the upper pole right kidney. This is associated  with fairly extensive perinephric hemorrhage around the upper pole right kidney extending anteriorly around the IVC and inferiorly in the perinephric space around the right kidney. Kidney is displaced inferiorly and rotated secondary to mass-effect from the lesion and hemorrhage. Right renal vein is markedly attenuated with only a string sign of opacification visible. Intraluminal thrombus in the right renal vein is not excluded. The right renal artery is markedly attenuated but does opacify. Stomach/Bowel: Stomach is unremarkable. No gastric wall thickening. No evidence of outlet obstruction. Duodenum is normally positioned as is the ligament of Treitz. No small bowel wall thickening. No small bowel dilatation. The terminal ileum is normal. The appendix is normal. No gross colonic mass. No colonic wall thickening. Vascular/Lymphatic: There is mild atherosclerotic calcification of the abdominal aorta without aneurysm. There is no gastrohepatic or hepatoduodenal ligament lymphadenopathy. No retroperitoneal or mesenteric lymphadenopathy. No pelvic sidewall lymphadenopathy. Reproductive: Uterus surgically absent.  There is no adnexal mass. Other: Trace free fluid is seen in the cul-de-sac. Musculoskeletal: No worrisome lytic or sclerotic osseous abnormality. Diffuse degenerative disc disease noted in the lumbar spine. IMPRESSION: 1. 5.3 x 5.6 x 5.0 cm masslike lesion in the upper pole right kidney with fairly extensive perilesional and perinephric hemorrhage around the upper pole right kidney extending anteriorly around the IVC and inferiorly in the perinephric space around the right kidney. Imaging features are most suggestive of renal cell carcinoma with associated hemorrhage. Right renal vein is markedly attenuated with only a string sign of opacification visible. Intraluminal thrombus in the right renal vein is not excluded. MRI abdomen with and without contrast recommended to further evaluate. 2. The right renal  artery is markedly attenuated but does opacify. 3. Aortic Atherosclerosis (ICD10-I70.0). Electronically Signed   By: Misty Stanley M.D.   On: 10/26/2021 13:48  ? ?US Abdomen Limited RUQ (LIVER/GB) ? ?Result Date: 10/26/2021 ?CLINICAL DATA:  Right upper quadrant abdominal pain. Sudden onset this morning. Nausea and vomiting. EXAM: ULTRASOUND ABDOMEN LIMITED RIGHT UPPER QUADRANT COMPARISON:  Report from right upper quadrant abdominal ultrasound 01/31/2010; images unavailable. FINDINGS: Gallbladder: No gallstones or wall thickening visualized. No sonographic Murphy sign noted by sonographer. Common bile duct: Diameter: 5 mm, within normal limits. Liver: Smooth liver contours. Mildly increased liver echogenicity compatible with fatty infiltration. Portal vein is patent on color Doppler imaging with normal direction of blood flow towards the liver. Other: There is incidental note of a heterogeneous echogenicity mass in the region of the upper pole of the right kidney measuring approximately 6.6 x 5.5 x 6.1 cm. This demonstrates internal color flow vascularity and is suspicious for a solid renal mass of renal cell carcinoma. IMPRESSION:: IMPRESSION: 1. Normal appearance of the gallbladder. 2. Possible mild fatty infiltration of the liver. 3. A hypervascular mass was visualized in  the region of the upper pole of the right kidney. This is concerning for renal cell carcinoma. Recommend CT of the abdomen for further evaluation. Electronically Signed   By: Yvonne Kendall M.D.   On: 10/26/2021 13:05   ? ? ?PROCEDURES: ? ?Critical Care performed: No ? ?Procedures ? ? ?MEDICATIONS ORDERED IN ED: ?Medications  ?morphine (PF) 4 MG/ML injection 4 mg (4 mg Intravenous Incomplete 10/26/21 1238)  ?ondansetron [facility]) injection 4 mg (4 mg Intravenous Given 10/26/21 1237)  ?iohexol (OMNIPAQUE) 300 MG/ML solution 100 mL (100 mLs Intravenous Contrast Given 10/26/21 1329)  ? ? ? ?IMPRESSION / MDM / ASSESSMENT AND PLAN / ED COURSE  ?I reviewed  the triage vital signs and the nursing notes. ?             ?               ? ?Differential diagnosis includes, but is not limited to, biliary disease (biliary colic, acute cholecystitis, cholangitis, chole

## 2021-10-26 NOTE — Consult Note (Signed)
?  ? ?Urology Consult  ? ?I have been asked to see the patient by Lillia Mountain, PA for evaluation and management of right flank pain, nausea/vomiting, and right renal mass with hemorrhage. ? ?Chief Complaint: Right flank pain ? ?HPI:  ?Kristina Le is a 76 y.o. year old relatively healthy female with normal renal function and creatinine of 0.63, EGFR greater than 60 who presents with acute onset of severe 10/10 right-sided flank pain this morning as well as nausea and vomiting.  Work-up with CT abdomen and pelvis with contrast showed a 5 cm enhancing upper pole right renal mass with suspected perinephric hemorrhage, no hydronephrosis, no definite lymphadenopathy, and possible right renal vein thrombus. ? ?She denies any prior similar episodes of pain.  No prior cross-sectional imaging to review.  Her pain is currently much better controlled in the ER.  She denies any shortness of breath or chest pain.  No gross hematuria. ? ?Urinalysis with microscopic hematuria with 6-10 RBCs but otherwise benign, hematocrit 36.6, WBC 8.0. ? ?She reportedly has a history of a brain aneurysm with multiple clips that are not MRI compatible. ? ?PMH: ?Past Medical History:  ?Diagnosis Date  ? Brain aneurysm 1997  ? GERD (gastroesophageal reflux disease)   ? Hypertension   ? Sarcoid   ? ? ?Surgical History: ?Past Surgical History:  ?Procedure Laterality Date  ? ABDOMINAL HYSTERECTOMY    ? BREAST SURGERY    ? ESOPHAGOGASTRODUODENOSCOPY (EGD) WITH PROPOFOL N/A 08/29/2015  ? Procedure: ESOPHAGOGASTRODUODENOSCOPY (EGD) WITH PROPOFOL;  Surgeon: Hulen Luster, MD;  Location: [facility] ENDOSCOPY;  Service: Gastroenterology;  Laterality: N/A;  ? FOOT SURGERY Right   ? FUNCTIONAL ENDOSCOPIC SINUS SURGERY    ? HAMMER TOE SURGERY    ? HEMORRHOID SURGERY    ? INTRACRANIAL ANEURYSM REPAIR    ? TONSILLECTOMY    ? TUMOR EXCISION    ? hand and finger  ? ? ? ?Allergies:  ?Allergies  ?Allergen Reactions  ? Protonix [Pantoprazole Sodium] Nausea And Vomiting   ?  Immediately after first dose  ? Ace Inhibitors   ?  Other reaction(s): Cough, Other  ? ? ?Family History: ?No family history on file. ? ?Social History:  reports that she has quit smoking. She has never used smokeless tobacco. She reports current alcohol use. She reports that she does not use drugs. ? ?ROS: ?Negative aside from those stated in the HPI. ? ?Physical Exam: ?BP (!) 139/44   Pulse 76   Temp 98.5 ?F (36.9 ?C) (Oral)   Resp (!) 21   Ht 5' 3.5" (1.613 m)   Wt 77.6 kg   SpO2 94%   BMI 29.82 kg/m?   ? ?Constitutional:  Alert and oriented, No acute distress. ?Cardiovascular: No clubbing, cyanosis, or edema. ?Respiratory: Normal respiratory effort, no increased work of breathing. ?GI: Abdomen is soft, nontender, nondistended, no abdominal masses ?GU: Mild right CVA tenderness ?Lymph: No cervical or inguinal lymphadenopathy. ?Skin: No rashes, bruises or suspicious lesions. ?Neurologic: Grossly intact, no focal deficits, moving all 4 extremities. ?Psychiatric: Normal mood and affect. ? ?Laboratory Data: ?Reviewed, see HPI ? ?Pertinent Imaging: ?I have personally reviewed the CT abdomen and pelvis with contrast showed a 5 cm enhancing upper pole right renal mass with suspected perinephric hemorrhage, no hydronephrosis, no definite lymphadenopathy, and possible right renal vein thrombus. ? ?CT chest with no definite evidence of metastatic disease, relatively stable pulmonary nodules from prior imaging ? ?Assessment & Plan:   ?76 year old female who presents today with  acute onset of right-sided flank pain and nausea and vomiting with CT demonstrating a 5 cm enhancing right renal mass most consistent with RCC with surrounding perinephric hemorrhage as the etiology of her acute flank pain.  There is a possible right renal vein thrombus as well, no definite evidence of metastatic disease on chest CT.   ? ?I had a long conversation with the patient and her daughter about the CT findings and presentation  being most consistent with a malignancy of the right kidney, most commonly RCC with acute bleed resulting in the CT findings and acute onset of flank pain.  We also discussed the possibility of the thrombus, and that typically we would recommend a MRI for further evaluation, which would impact surgical planning and approach.  She has been told that her brain aneurysm clips are not MRI compatible.  We also discussed that in the acute setting of hemorrhage the case would be extremely challenging and we typically would wait 4 to 6 weeks at least prior to any surgical intervention.  We reviewed the role of the tamponade effect of Gerota's fascia and that these bleeds often stop spontaneously and do not require embolization or intervention acutely.  We reviewed that radical nephrectomy is the primary management of kidney cancer, and that there is sometimes a role for adjuvant immunotherapy pending pathology results. ? ?I recommended admission to trend hematocrit and pain/nausea control. ? ?She is interested in having further evaluation and potentially surgical treatment at [facility], which is very reasonable with her possible renal thrombus.   ? ?Recommendations: ?-Admit to hospitalist and trend hematocrit, transfuse as needed ?-Continue pain and nausea control ?-Will reach out to [facility] urologic oncology to try to coordinate follow-up ASAP ? ?Billey Co, MD ? ? ?[place] ?[address] ?[place], [place] [address] ?[phone] ? ?

## 2021-10-26 NOTE — ED Notes (Signed)
Informed RN bed assigned 

## 2021-10-26 NOTE — ED Notes (Signed)
ED Provider at bedside. 

## 2021-10-26 NOTE — H&P (Addendum)
History and Physical  Kristina Le TKZ:[id number] DOB: [birth year] DOA: 10/26/2021  Referring physician: Dr. Karmen Stabs, EDP  PCP: Danella Penton, MD  Outpatient Specialists: Dermatology Patient coming from: Home via EMS.  Chief Complaint: Right flank pain.  HPI: Kristina Le is a 76 y.o. female with medical history significant for seborrheic keratosis, history of nonmelanoma skin cancer, history of brain aneurysm status post clips placement, hypertension, GERD, asthma, pulmonary nodules, who presented to [facility] ED with complaints of sudden onset severe right flank pain this morning.  Associated with nausea and vomiting x1.  Upon presentation to the ED, work-up revealed a 5 cm enhancing right renal mass most consistent with RCC with surrounding perinephric hemorrhage, likely the etiology of her acute flank pain.  Also possible right renal vein thrombus.  Per the patient, clips in her brain are not compatible with MRI.  She had a CT chest done for staging which revealed pulmonary nodules with no definite evidence of metastatic disease on chest CT.  The patient received IV fluid, IV antiemetics, and IV analgesics with improvement of her symptomatology in the ED.  Seen by urology.  Admitted by the hospitalist service, TRH.   ED Course: Tmax 98.5.  BP 152/69, pulse 76, respiratory 22, saturation 100% on 3L.  Lab studies remarkable for serum potassium 3.0, glucose 184.  Review of Systems: Review of systems as noted in the HPI. All other systems reviewed and are negative.   Past Medical History:  Diagnosis Date   Brain aneurysm 1997   GERD (gastroesophageal reflux disease)    Hypertension    Sarcoid    Past Surgical History:  Procedure Laterality Date   ABDOMINAL HYSTERECTOMY     BREAST SURGERY     ESOPHAGOGASTRODUODENOSCOPY (EGD) WITH PROPOFOL N/A 08/29/2015   Procedure: ESOPHAGOGASTRODUODENOSCOPY (EGD) WITH PROPOFOL;  Surgeon: Wallace Cullens, MD;  Location: [facility] ENDOSCOPY;  Service:  Gastroenterology;  Laterality: N/A;   FOOT SURGERY Right    FUNCTIONAL ENDOSCOPIC SINUS SURGERY     HAMMER TOE SURGERY     HEMORRHOID SURGERY     INTRACRANIAL ANEURYSM REPAIR     TONSILLECTOMY     TUMOR EXCISION     hand and finger    Social History:  reports that she has quit smoking. She has never used smokeless tobacco. She reports current alcohol use. She reports that she does not use drugs.   Allergies  Allergen Reactions   Protonix [Pantoprazole Sodium] Nausea And Vomiting    Immediately after first dose   Ace Inhibitors     Other reaction(s): Cough, Other   Family history: Mother with history of Alzheimer's dementia    Prior to Admission medications   Medication Sig Start Date End Date Taking? Authorizing Provider  amLODipine (NORVASC) 5 MG tablet Take 5 mg by mouth daily.   Yes [provider]  ascorbic acid (VITAMIN C) 1000 MG tablet Take 1,000 mg by mouth once a week.   Yes [provider]  buPROPion (WELLBUTRIN SR) 150 MG 12 hr tablet Take 150 mg by mouth daily.   Yes [provider]  calcium citrate-vitamin D (CITRACAL+D) 315-200 MG-UNIT tablet Take 1 tablet by mouth once a week.   Yes [provider]  celecoxib (CELEBREX) 200 MG capsule Take 200 mg by mouth daily.   Yes [provider]  cetirizine (ZYRTEC) 10 MG tablet Take 10 mg by mouth daily.   Yes [provider]  Cholecalciferol 1000 units tablet Take 1,000 Units by mouth  daily.   Yes [provider]  diphenhydrAMINE (BENADRYL) 25 mg capsule Take 25 mg by mouth every 6 (six) hours as needed for itching. 02/20/19  Yes [provider]  docusate sodium (COLACE) 100 MG capsule Take 100 mg by mouth daily.   Yes [provider]  Eszopiclone 3 MG TABS Take 3 mg by mouth at bedtime as needed. 02/27/16  Yes [provider]  famotidine (PEPCID) 40 MG tablet Take 40 mg by mouth at bedtime. 12/08/18 03/20/22 Yes [provider]   fluticasone (FLONASE) 50 MCG/ACT nasal spray Place 2 sprays into both nostrils daily.   Yes [provider]  fluticasone (FLONASE) 50 MCG/ACT nasal spray Place 2 sprays into both nostrils daily. 06/21/20  Yes [provider]  fluticasone-salmeterol (ADVAIR) 100-50 MCG/ACT AEPB Inhale 1 puff into the lungs every 12 (twelve) hours. 01/02/12  Yes [provider]  hydrochlorothiazide (HYDRODIURIL) 25 MG tablet Take 25 mg by mouth daily.   Yes [provider]  Lysine 500 MG TABS Take 500 mg by mouth daily.   Yes [provider]  Magnesium 100 MG TABS Take 1,250 mg by mouth 2 (two) times daily.   Yes [provider]  montelukast (SINGULAIR) 10 MG tablet Take 10 mg by mouth every evening. 02/27/16  Yes [provider]  Multiple Vitamin (MULTIVITAMIN) tablet Take 1 tablet by mouth daily.   Yes [provider]  niacin 100 MG tablet Take 100 mg by mouth at bedtime.   Yes [provider]  Omega 3 1000 MG CAPS Take 1,000 capsules by mouth daily.   Yes [provider]  pantoprazole (PROTONIX) 40 MG tablet Take 40 mg by mouth daily before breakfast. 08/03/15  Yes [provider]  Red Yeast Rice 600 MG CAPS Take 1,200 mg by mouth daily.   Yes [provider]  albuterol (PROVENTIL HFA;VENTOLIN HFA) 108 (90 Base) MCG/ACT inhaler Inhale 1 puff into the lungs every 6 (six) hours as needed for wheezing or shortness of breath.    [provider]    Physical Exam: BP (!) 153/69   Pulse 76   Temp 98.3 F (36.8 C)   Resp 17   Ht 5' 3.5" (1.613 m)   Wt 77.6 kg   SpO2 100%   BMI 29.82 kg/m   General: 76 y.o. year-old female well developed well nourished in no acute distress.  Alert and oriented x3. Cardiovascular: Regular rate and rhythm with no rubs or gallops.  No thyromegaly or JVD noted.  No lower extremity edema. 2/4 pulses in all 4 extremities. Respiratory: Clear to auscultation with no wheezes  or rales. Good inspiratory effort. Abdomen: Soft right flank tender nondistended with normal bowel sounds x4 quadrants. Muskuloskeletal: No cyanosis, clubbing or edema noted bilaterally Neuro: CN II-XII intact, strength, sensation, reflexes Skin: No ulcerative lesions noted or rashes Psychiatry: Judgement and insight appear normal. Mood is appropriate for condition and setting          Labs on Admission:  Basic Metabolic Panel: Recent Labs  Lab 10/26/21 1125  NA 139  K 3.0*  CL 100  CO2 29  GLUCOSE 184*  BUN 20  CREATININE 0.63  CALCIUM 9.0   Liver Function Tests: Recent Labs  Lab 10/26/21 1125  AST 26  ALT 20  ALKPHOS 45  BILITOT 0.6  PROT 6.7  ALBUMIN 3.6   Recent Labs  Lab 10/26/21 1125  LIPASE 28   No results for input(s): AMMONIA in the last  168 hours. CBC: Recent Labs  Lab 10/26/21 1125  WBC 8.0  HGB 12.1  HCT 36.6  MCV 90.8  PLT 345   Cardiac Enzymes: No results for input(s): CKTOTAL, CKMB, CKMBINDEX, TROPONINI in the last 168 hours.  BNP (last 3 results) No results for input(s): BNP in the last 8760 hours.  ProBNP (last 3 results) No results for input(s): PROBNP in the last 8760 hours.  CBG: No results for input(s): GLUCAP in the last 168 hours.  Radiological Exams on Admission: CT CHEST W CONTRAST  Result Date: 10/26/2021 CLINICAL DATA:  Renal cell carcinoma, staging. * Tracking Code: BO * EXAM: CT CHEST WITH CONTRAST TECHNIQUE: Multidetector CT imaging of the chest was performed during intravenous contrast administration. RADIATION DOSE REDUCTION: This exam was performed according to the departmental dose-optimization program which includes automated exposure control, adjustment of the mA and/or kV according to patient size and/or use of iterative reconstruction technique. CONTRAST:  50mL OMNIPAQUE IOHEXOL 300 MG/ML  SOLN COMPARISON:  Chest CT November 20, 2010 and same day CT of the abdomen pelvis dated Oct 26 2021. FINDINGS: Cardiovascular:  Aortic atherosclerosis without aneurysmal dilation of thoracic aorta. No central pulmonary embolus on this nondedicated study. Normal size heart. No significant pericardial effusion/thickening. Left anterior descending calcifications aortic annulus. Mediastinum/Nodes: No suspicious thyroid nodule. No pathologically enlarged mediastinal, hilar or axillary lymph nodes. Small hiatal hernia. Lungs/Pleura: Mild biapical pleuroparenchymal scarring. Scattered pulmonary nodules.  For reference: -New solid left upper lobe pulmonary nodule measures 5 mm on image 62/3 -Left lower lobe pulmonary nodule measures 8 mm on image 105/, unchanged. Calcified left lower lobe granuloma. Subsegmental atelectasis in the dependent right lung. No pleural effusion. No pneumothorax. Upper Abdomen: Please refer to same day CT of the abdomen and pelvis from 1538 hours regarding complete description of findings below diaphragm including the right renal mass and hemorrhage. Musculoskeletal: Mild S-shaped curvature of the thoracolumbar spine with associated degenerative change. IMPRESSION: 1. New solid 5 mm left upper lobe pulmonary nodule, which is nonspecific, recommend follow-up dedicated short-term interval chest CT. 2. Chronic scattered pulmonary nodules measuring up to 8 mm in the left lower lobe, unchanged from chest CT from 2011 consistent with benign finding. 3. Please refer to same day CT of the abdomen and pelvis from 1538 hours regarding complete description of findings below diaphragm including the right renal mass and hemorrhage 4.  Aortic Atherosclerosis (ICD10-I70.0). Electronically Signed   By: Maudry Mayhew M.D.   On: 10/26/2021 18:18   CT ABDOMEN PELVIS W CONTRAST  Result Date: 10/26/2021 CLINICAL DATA:  Right abdominal pain. EXAM: CT ABDOMEN AND PELVIS WITH CONTRAST TECHNIQUE: Multidetector CT imaging of the abdomen and pelvis was performed using the standard protocol following bolus administration of intravenous contrast.  RADIATION DOSE REDUCTION: This exam was performed according to the departmental dose-optimization program which includes automated exposure control, adjustment of the mA and/or kV according to patient size and/or use of iterative reconstruction technique. CONTRAST:  OMNIPAQUE IOHEXOL 300 MG/ML  SOLN COMPARISON:  None Available. FINDINGS: Lower chest: Unremarkable. Hepatobiliary: No suspicious focal abnormality within the liver parenchyma. There is no evidence for gallstones, gallbladder wall thickening, or pericholecystic fluid. No intrahepatic or extrahepatic biliary dilation. Pancreas: No focal mass lesion. No dilatation of the main duct. No intraparenchymal cyst. No peripancreatic edema. Spleen: No splenomegaly. No focal mass lesion. Adrenals/Urinary Tract: No adrenal nodule or mass. Left kidney and ureter unremarkable. Bladder is unremarkable. 5.3 x 5.6 x 5.0 cm masslike lesion identified in the  upper pole right kidney. This is associated with fairly extensive perinephric hemorrhage around the upper pole right kidney extending anteriorly around the IVC and inferiorly in the perinephric space around the right kidney. Kidney is displaced inferiorly and rotated secondary to mass-effect from the lesion and hemorrhage. Right renal vein is markedly attenuated with only a string sign of opacification visible. Intraluminal thrombus in the right renal vein is not excluded. The right renal artery is markedly attenuated but does opacify. Stomach/Bowel: Stomach is unremarkable. No gastric wall thickening. No evidence of outlet obstruction. Duodenum is normally positioned as is the ligament of Treitz. No small bowel wall thickening. No small bowel dilatation. The terminal ileum is normal. The appendix is normal. No gross colonic mass. No colonic wall thickening. Vascular/Lymphatic: There is mild atherosclerotic calcification of the abdominal aorta without aneurysm. There is no gastrohepatic or hepatoduodenal ligament  lymphadenopathy. No retroperitoneal or mesenteric lymphadenopathy. No pelvic sidewall lymphadenopathy. Reproductive: Uterus surgically absent.  There is no adnexal mass. Other: Trace free fluid is seen in the cul-de-sac. Musculoskeletal: No worrisome lytic or sclerotic osseous abnormality. Diffuse degenerative disc disease noted in the lumbar spine. IMPRESSION: 1. 5.3 x 5.6 x 5.0 cm masslike lesion in the upper pole right kidney with fairly extensive perilesional and perinephric hemorrhage around the upper pole right kidney extending anteriorly around the IVC and inferiorly in the perinephric space around the right kidney. Imaging features are most suggestive of renal cell carcinoma with associated hemorrhage. Right renal vein is markedly attenuated with only a string sign of opacification visible. Intraluminal thrombus in the right renal vein is not excluded. MRI abdomen with and without contrast recommended to further evaluate. 2. The right renal artery is markedly attenuated but does opacify. 3. Aortic Atherosclerosis (ICD10-I70.0). Electronically Signed   By: Kennith Center M.D.   On: 10/26/2021 13:48   US Abdomen Limited RUQ (LIVER/GB)  Result Date: 10/26/2021 CLINICAL DATA:  Right upper quadrant abdominal pain. Sudden onset this morning. Nausea and vomiting. EXAM: ULTRASOUND ABDOMEN LIMITED RIGHT UPPER QUADRANT COMPARISON:  Report from right upper quadrant abdominal ultrasound 01/31/2010; images unavailable. FINDINGS: Gallbladder: No gallstones or wall thickening visualized. No sonographic Murphy sign noted by sonographer. Common bile duct: Diameter: 5 mm, within normal limits. Liver: Smooth liver contours. Mildly increased liver echogenicity compatible with fatty infiltration. Portal vein is patent on color Doppler imaging with normal direction of blood flow towards the liver. Other: There is incidental note of a heterogeneous echogenicity mass in the region of the upper pole of the right kidney measuring  approximately 6.6 x 5.5 x 6.1 cm. This demonstrates internal color flow vascularity and is suspicious for a solid renal mass of renal cell carcinoma. IMPRESSION:: IMPRESSION: 1. Normal appearance of the gallbladder. 2. Possible mild fatty infiltration of the liver. 3. A hypervascular mass was visualized in the region of the upper pole of the right kidney. This is concerning for renal cell carcinoma. Recommend CT of the abdomen for further evaluation. Electronically Signed   By: Neita Garnet M.D.   On: 10/26/2021 13:05    EKG: I independently viewed the EKG done and my findings are as followed: Sinus rhythm rate of 69.  Nonspecific ST-T changes.  QTc 430.  Assessment/Plan Present on Admission:  Renal mass  Principal Problem:   Renal mass  Right renal mass with suspicion for renal cell carcinoma. CT demonstrating a 5 cm enhancing right renal mass most consistent with RCC with surrounding perinephric hemorrhage CT chest showing pulmonary nodules, largest measuring  8 mm. Seen by urology.  Urology will arrange for outpatient urology oncology at [facility] Pain control and bowel regimen IV antiemetics as needed  Possible right renal vein thrombus Urology recommended MRI to further assess, however the patient has a history of brain aneurysm with multiple clips that are not MRI compatible. Perinephric hemorrhage precludes the use of anticoagulation.  Pulmonary nodules, up to 8 mm in size She had a CT chest with contrast done for staging Will need close surveillance outpatient for pulmonary nodules  History of brain aneurysm status post clips placement. Goal BP normotensive less than 130/80 Resume home oral antihypertensives Closely monitor vital signs  Hypokalemia Serum potassium 3.0 Repleted intravenously Repeat renal panel in the morning.  Hyperglycemia No prior history of diabetes Obtain hemoglobin A1c Insulin sliding scale.  Asthma Resume home regimen  GERD Resume home  regimen   DVT prophylaxis: SCDs  Code Status: Full code  Family Communication: Husband and friends/neighbors at bedside  Disposition Plan: Admitted to MedSurg unit  Consults called: Urology consulted by EDP  Admission status: Observation status.   Status is: Observation    Darlin Drop MD [facility] Pager [phone]  If 7PM-7AM, please contact night-coverage [email or web address] Password [email or web address]  10/26/2021, 7:00 PM

## 2021-10-27 DIAGNOSIS — N2889 Other specified disorders of kidney and ureter: Secondary | ICD-10-CM | POA: Diagnosis not present

## 2021-10-27 LAB — GLUCOSE, CAPILLARY
Glucose-Capillary: 143 mg/dL — ABNORMAL HIGH (ref 70–99)
Glucose-Capillary: 157 mg/dL — ABNORMAL HIGH (ref 70–99)

## 2021-10-27 LAB — CBC WITH DIFFERENTIAL/PLATELET
Abs Immature Granulocytes: 0.1 10*3/uL — ABNORMAL HIGH (ref 0.00–0.07)
Basophils Absolute: 0 10*3/uL (ref 0.0–0.1)
Basophils Relative: 0 %
Eosinophils Absolute: 0 10*3/uL (ref 0.0–0.5)
Eosinophils Relative: 0 %
HCT: 31 % — ABNORMAL LOW (ref 36.0–46.0)
Hemoglobin: 10.4 g/dL — ABNORMAL LOW (ref 12.0–15.0)
Immature Granulocytes: 1 %
Lymphocytes Relative: 5 %
Lymphs Abs: 0.7 10*3/uL (ref 0.7–4.0)
MCH: 30.8 pg (ref 26.0–34.0)
MCHC: 33.5 g/dL (ref 30.0–36.0)
MCV: 91.7 fL (ref 80.0–100.0)
Monocytes Absolute: 1 10*3/uL (ref 0.1–1.0)
Monocytes Relative: 7 %
Neutro Abs: 11.7 10*3/uL — ABNORMAL HIGH (ref 1.7–7.7)
Neutrophils Relative %: 87 %
Platelets: 276 10*3/uL (ref 150–400)
RBC: 3.38 MIL/uL — ABNORMAL LOW (ref 3.87–5.11)
RDW: 13.7 % (ref 11.5–15.5)
WBC: 13.5 10*3/uL — ABNORMAL HIGH (ref 4.0–10.5)
nRBC: 0 % (ref 0.0–0.2)

## 2021-10-27 LAB — HEMOGLOBIN AND HEMATOCRIT, BLOOD
HCT: 30.2 % — ABNORMAL LOW (ref 36.0–46.0)
Hemoglobin: 10.1 g/dL — ABNORMAL LOW (ref 12.0–15.0)

## 2021-10-27 LAB — COMPREHENSIVE METABOLIC PANEL
ALT: 19 U/L (ref 0–44)
AST: 24 U/L (ref 15–41)
Albumin: 3.4 g/dL — ABNORMAL LOW (ref 3.5–5.0)
Alkaline Phosphatase: 41 U/L (ref 38–126)
Anion gap: 8 (ref 5–15)
BUN: 24 mg/dL — ABNORMAL HIGH (ref 8–23)
CO2: 29 mmol/L (ref 22–32)
Calcium: 8.6 mg/dL — ABNORMAL LOW (ref 8.9–10.3)
Chloride: 102 mmol/L (ref 98–111)
Creatinine, Ser: 0.66 mg/dL (ref 0.44–1.00)
GFR, Estimated: >60 mL/min (ref >60)
Glucose, Bld: 141 mg/dL — ABNORMAL HIGH (ref 70–99)
Potassium: 3.7 mmol/L (ref 3.5–5.1)
Sodium: 139 mmol/L (ref 135–145)
Total Bilirubin: 0.6 mg/dL (ref 0.3–1.2)
Total Protein: 6.4 g/dL — ABNORMAL LOW (ref 6.5–8.1)

## 2021-10-27 LAB — HEMOGLOBIN A1C
Hgb A1c MFr Bld: 5.9 % — ABNORMAL HIGH (ref 4.8–5.6)
Mean Plasma Glucose: 122.63 mg/dL

## 2021-10-27 LAB — MAGNESIUM: Magnesium: 1.9 mg/dL (ref 1.7–2.4)

## 2021-10-27 LAB — PHOSPHORUS: Phosphorus: 3.8 mg/dL (ref 2.5–4.6)

## 2021-10-27 MED ORDER — OMEGA 3 1000 MG PO CAPS
1000.0000 mg | ORAL_CAPSULE | Freq: Every day | ORAL | Status: AC
Start: 2021-10-27 — End: ?

## 2021-10-27 MED ORDER — CELECOXIB 200 MG PO CAPS
200.0000 mg | ORAL_CAPSULE | Freq: Every day | ORAL | Status: AC | PRN
Start: 2021-10-27 — End: ?

## 2021-10-27 NOTE — Progress Notes (Signed)
? ?  Subjective ?Feels well this AM, denies any right sided flank pain or nausea, significantly improved from last night ?Vitals WNL ? ?Physical Exam: ?BP (!) 123/57 (BP Location: Left Arm)   Pulse 69   Temp 98.4 ?F (36.9 ?C) (Oral)   Resp 17   Ht 5' 3.5" (1.613 m)   Wt 77.6 kg   SpO2 98%   BMI 29.82 kg/m?   ? ?Constitutional:  Alert and oriented, No acute distress. ?Respiratory: Normal respiratory effort, no increased work of breathing. ?GI: Abdomen is soft, non-tender, non-distended ?GU: No CVA tenderness ? ?Laboratory Data: ?Reviewed in epic ?Cr stable 0.66 ?Hct 31(36) ? ? ?Assessment & Plan:   ?76 year old female who presented 10/26/21 with acute onset of right-sided flank pain and nausea and vomiting with CT demonstrating a 5 cm enhancing right renal mass most consistent with RCC with surrounding perinephric hemorrhage.  There is a possible right renal vein thrombus as well, no definite evidence of metastatic disease on chest CT. hematocrit 31 from 36 yesterday, pain and nausea completely resolved today.  Will repeat H/H this afternoon and if stable can discharge from a urology perspective today. ? ?Follow-up 5/19 at 12:30 PM with Dr. Armandina Stammer urologic oncologist at [facility] ? ?I spent 35 total minutes on the floor with greater than 50% spent with the patient and her husband on the phone regarding coordination of care for right renal mass with hemorrhage and possible renal vein thrombus, with close follow-up at [facility] next week. ? ? ?Sondra Come, MD ? ? ? ?

## 2021-10-27 NOTE — Plan of Care (Signed)
?  Problem: Education: ?Goal: Knowledge of General Education information will improve ?Description: Including pain rating scale, medication(s)/side effects and non-pharmacologic comfort measures ?Outcome: Progressing ?  ?Problem: Clinical Measurements: ?Goal: Ability to maintain clinical measurements within normal limits will improve ?Outcome: Progressing ?Goal: Will remain free from infection ?Outcome: Progressing ?Goal: Respiratory complications will improve ?Outcome: Progressing ?Goal: Cardiovascular complication will be avoided ?Outcome: Progressing ?  ?Problem: Activity: ?Goal: Risk for activity intolerance will decrease ?Outcome: Progressing ?  ?Problem: Nutrition: ?Goal: Adequate nutrition will be maintained ?Outcome: Progressing ?  ?Problem: Coping: ?Goal: Level of anxiety will decrease ?Outcome: Progressing ?  ?Problem: Pain Managment: ?Goal: General experience of comfort will improve ?Outcome: Progressing ?  ?Problem: Safety: ?Goal: Ability to remain free from injury will improve ?Outcome: Progressing ?  ?

## 2021-10-27 NOTE — Discharge Summary (Signed)
? ?Physician Discharge Summary ? ? ?Kristina Le  female DOB: 11/20/1945  ?[id number] ? ?PCP: Danella PentonMiller, Mark F, MD ? ?Admit date: 10/26/2021 ?Discharge date: 10/27/2021 ? ?Admitted From: home ?Disposition:  home ?CODE STATUS: Full code ? ? ?Hospital Course:  ?For full details, please see H&P, progress notes, consult notes and ancillary notes.  ?Briefly,  ?Kristina Le is a 76 y.o. female with medical history significant for seborrheic keratosis, nonmelanoma skin cancer, brain aneurysm status post clips placement, hypertension, asthma, pulmonary nodules, who presented to [facility] ED with complaints of sudden onset severe right flank pain.   ? ?Upon presentation to the ED, work-up revealed a 5 cm enhancing right renal mass most consistent with RCC with surrounding perinephric hemorrhage, likely the etiology of her acute flank pain.  Also possible right renal vein thrombus.  Per the patient, clips in her brain are not compatible with MRI.  She had a CT chest done for staging which revealed pulmonary nodules with no definite evidence of metastatic disease on chest CT.  ?  ?Right renal mass with suspicion for renal cell carcinoma. ?Urology consulted, who arranged [facility]. ? ?Acute blood loss anemia ?--Hgb ~10 on presentation.  Baseline 12.  Hgb drop likely due to perinephric hemorrhage.  Hgb stable and unchanged prior to discharge. ?  ?Possible right renal vein thrombus ?Urology recommended MRI to further assess, however the patient has a history of brain aneurysm with multiple clips that are not MRI compatible. ?Perinephric hemorrhage precludes the use of anticoagulation. ?  ?Pulmonary nodules, up to 8 mm in size ?Will need close surveillance outpatient for pulmonary nodules ?  ?History of brain aneurysm status post clips placement. ? ?Hypokalemia ?Repleted  ? ?Hyperglycemia ?Prediabetes ?--A1c 5.9 ?  ?Asthma ?Stable.  Cont home bronchodilator. ?  ?GERD ?Cont home Pepcid ? ? ?Discharge  Diagnoses:  ?Principal Problem: ?  Renal mass ? ? ? ? ?Discharge Instructions: ? ?Allergies as of 10/27/2021   ? ?   Reactions  ? Protonix [pantoprazole Sodium] Nausea And Vomiting  ? Immediately after first dose  ? Ace Inhibitors   ? Other reaction(s): Cough, Other  ? ?  ? ?  ?Medication List  ?  ? ?TAKE these medications   ? ?albuterol 108 (90 Base) MCG/ACT inhaler ?Commonly known as: VENTOLIN HFA ?Inhale 1 puff into the lungs every 6 (six) hours as needed for wheezing or shortness of breath. ?  ?amLODipine 5 MG tablet ?Commonly known as: NORVASC ?Take 5 mg by mouth daily. ?  ?ascorbic acid 1000 MG tablet ?Commonly known as: VITAMIN C ?Take 1,000 mg by mouth once a week. ?  ?buPROPion 150 MG 24 hr tablet ?Commonly known as: WELLBUTRIN XL ?Take 150 mg by mouth daily. ?  ?calcium citrate-vitamin D 315-200 MG-UNIT tablet ?Commonly known as: CITRACAL+D ?Take 1 tablet by mouth once a week. ?  ?celecoxib 200 MG capsule ?Commonly known as: CELEBREX ?Take 1 capsule (200 mg total) by mouth daily as needed (home med). ?What changed:  ?when to take this ?reasons to take this ?  ?cetirizine 10 MG tablet ?Commonly known as: ZYRTEC ?Take 10 mg by mouth daily. ?  ?Cholecalciferol 25 MCG (1000 UT) tablet ?Take 1,000 Units by mouth daily. ?  ?diphenhydrAMINE 25 mg capsule ?Commonly known as: BENADRYL ?Take 25 mg by mouth every 6 (six) hours as needed for itching. ?  ?docusate sodium 100 MG capsule ?Commonly known as: COLACE ?Take 100 mg by mouth daily. ?  ?  Eszopiclone 3 MG Tabs ?Take 3 mg by mouth at bedtime as needed. ?  ?famotidine 40 MG tablet ?Commonly known as: PEPCID ?Take 40 mg by mouth at bedtime. ?  ?fluticasone 50 MCG/ACT nasal spray ?Commonly known as: FLONASE ?Place 2 sprays into both nostrils daily. ?  ?fluticasone 50 MCG/ACT nasal spray ?Commonly known as: FLONASE ?Place 2 sprays into both nostrils daily. ?  ?fluticasone-salmeterol 100-50 MCG/ACT Aepb ?Commonly known as: ADVAIR ?Inhale 1 puff into the lungs every 12  (twelve) hours. ?  ?hydrochlorothiazide 25 MG tablet ?Commonly known as: HYDRODIURIL ?Take 25 mg by mouth daily. ?  ?Lysine 500 MG Tabs ?Take 500 mg by mouth daily. ?  ?Magnesium 100 MG Tabs ?Take 1,250 mg by mouth 2 (two) times daily. ?  ?montelukast 10 MG tablet ?Commonly known as: SINGULAIR ?Take 10 mg by mouth every evening. ?  ?multivitamin tablet ?Take 1 tablet by mouth daily. ?  ?niacin 100 MG tablet ?Take 100 mg by mouth at bedtime. ?  ?Omega 3 1000 MG Caps ?Take 1 capsule (1,000 mg total) by mouth daily. Home med. ?What changed:  ?how much to take ?additional instructions ?  ?pantoprazole 40 MG tablet ?Commonly known as: PROTONIX ?Take 40 mg by mouth daily before breakfast. ?  ?Red Yeast Rice 600 MG Caps ?Take 1,200 mg by mouth daily. ?  ? ?  ? ? ? Follow-up Information   ? ? Jennette Kettle, MD Follow up on 11/03/2021.   ?Specialty: Urology ?Why: 5/19 at 12:30 PM ?Contact information: ?[address] ?[place] [address] ?[phone] ? ? ?  ?  ? ?  ?  ? ?  ? ? ?Allergies  ?Allergen Reactions  ? Protonix [Pantoprazole Sodium] Nausea And Vomiting  ?  Immediately after first dose  ? Ace Inhibitors   ?  Other reaction(s): Cough, Other  ? ? ? ?The results of significant diagnostics from this hospitalization (including imaging, microbiology, ancillary and laboratory) are listed below for reference.  ? ?Consultations: ? ? ?Procedures/Studies: ?CT CHEST W CONTRAST ? ?Result Date: 10/26/2021 ?CLINICAL DATA:  Renal cell carcinoma, staging. * Tracking Code: BO * EXAM: CT CHEST WITH CONTRAST TECHNIQUE: Multidetector CT imaging of the chest was performed during intravenous contrast administration. RADIATION DOSE REDUCTION: This exam was performed according to the departmental dose-optimization program which includes automated exposure control, adjustment of the mA and/or kV according to patient size and/or use of iterative reconstruction technique. CONTRAST:  62mL OMNIPAQUE IOHEXOL 300 MG/ML  SOLN COMPARISON:  Chest CT  November 20, 2010 and same day CT of the abdomen pelvis dated Oct 26 2021. FINDINGS: Cardiovascular: Aortic atherosclerosis without aneurysmal dilation of thoracic aorta. No central pulmonary embolus on this nondedicated study. Normal size heart. No significant pericardial effusion/thickening. Left anterior descending calcifications aortic annulus. Mediastinum/Nodes: No suspicious thyroid nodule. No pathologically enlarged mediastinal, hilar or axillary lymph nodes. Small hiatal hernia. Lungs/Pleura: Mild biapical pleuroparenchymal scarring. Scattered pulmonary nodules.  For reference: -New solid left upper lobe pulmonary nodule measures 5 mm on image 62/3 -Left lower lobe pulmonary nodule measures 8 mm on image 105/, unchanged. Calcified left lower lobe granuloma. Subsegmental atelectasis in the dependent right lung. No pleural effusion. No pneumothorax. Upper Abdomen: Please refer to same day CT of the abdomen and pelvis from 1538 hours regarding complete description of findings below diaphragm including the right renal mass and hemorrhage. Musculoskeletal: Mild S-shaped curvature of the thoracolumbar spine with associated degenerative change. IMPRESSION: 1. New solid 5 mm left upper lobe pulmonary nodule,  which is nonspecific, recommend follow-up dedicated short-term interval chest CT. 2. Chronic scattered pulmonary nodules measuring up to 8 mm in the left lower lobe, unchanged from chest CT from 2011 consistent with benign finding. 3. Please refer to same day CT of the abdomen and pelvis from 1538 hours regarding complete description of findings below diaphragm including the right renal mass and hemorrhage 4.  Aortic Atherosclerosis (ICD10-I70.0). Electronically Signed   By: Maudry Mayhew M.D.   On: 10/26/2021 18:18  ? ?CT ABDOMEN PELVIS W CONTRAST ? ?Result Date: 10/26/2021 ?CLINICAL DATA:  Right abdominal pain. EXAM: CT ABDOMEN AND PELVIS WITH CONTRAST TECHNIQUE: Multidetector CT imaging of the abdomen and pelvis  was performed using the standard protocol following bolus administration of intravenous contrast. RADIATION DOSE REDUCTION: This exam was performed according to the departmental dose-optimization program

## 2021-10-27 NOTE — Progress Notes (Signed)
Discharge instructions reviewed with patient including followup visits and medications changes.  Understanding was verbalized and all questions were answered.  IV removed without complication; patient tolerated well.  Patient discharged home via wheelchair in stable condition escorted by nursing staff. ? ?

## 2023-12-12 ENCOUNTER — Other Ambulatory Visit
Admission: RE | Admit: 2023-12-12 | Discharge: 2023-12-12 | Disposition: A | Discharge status: Home / Self Care | Source: Ambulatory Visit | Attending: Family Medicine | Admitting: Family Medicine

## 2023-12-12 DIAGNOSIS — R1013 Epigastric pain: Secondary | ICD-10-CM | POA: Insufficient documentation

## 2023-12-12 DIAGNOSIS — R079 Chest pain, unspecified: Secondary | ICD-10-CM | POA: Diagnosis present

## 2023-12-12 LAB — TROPONIN I (HIGH SENSITIVITY): Troponin I (High Sensitivity): 7 ng/L (ref <18)

## 2023-12-23 ENCOUNTER — Ambulatory Visit: Payer: Self-pay

## 2023-12-23 DIAGNOSIS — K31A15 Gastric intestinal metaplasia without dysplasia, involving multiple sites: Secondary | ICD-10-CM | POA: Diagnosis not present
